# Patient Record
Sex: Male | Born: 1955 | Race: White | Hispanic: No | Marital: Married | Smoking: Never smoker
Health system: Southern US, Community
[De-identification: ages and names within clinical notes are randomized; demographics above are authoritative.]

## PROBLEM LIST (undated history)

## (undated) DIAGNOSIS — T8859XA Other complications of anesthesia, initial encounter: Secondary | ICD-10-CM

## (undated) DIAGNOSIS — T7840XA Allergy, unspecified, initial encounter: Secondary | ICD-10-CM

## (undated) DIAGNOSIS — I10 Essential (primary) hypertension: Secondary | ICD-10-CM

## (undated) DIAGNOSIS — M1990 Unspecified osteoarthritis, unspecified site: Secondary | ICD-10-CM

## (undated) HISTORY — PX: ROTATOR CUFF REPAIR: SHX139

## (undated) HISTORY — DX: Essential (primary) hypertension: I10

## (undated) HISTORY — PX: KNEE ARTHROSCOPY: SUR90

## (undated) HISTORY — PX: COLONOSCOPY: SHX174

## (undated) HISTORY — PX: POLYPECTOMY: SHX149

## (undated) HISTORY — DX: Allergy, unspecified, initial encounter: T78.40XA

---

## 1985-04-05 HISTORY — PX: ROTATOR CUFF REPAIR: SHX139

## 1991-04-06 HISTORY — PX: TREATMENT FISTULA ANAL: SUR1390

## 2008-11-24 ENCOUNTER — Emergency Department (HOSPITAL_BASED_OUTPATIENT_CLINIC_OR_DEPARTMENT_OTHER): Admission: EM | Admit: 2008-11-24 | Discharge: 2008-11-24 | Payer: Self-pay | Admitting: Emergency Medicine

## 2009-07-01 ENCOUNTER — Encounter (INDEPENDENT_AMBULATORY_CARE_PROVIDER_SITE_OTHER): Payer: Self-pay

## 2009-07-04 ENCOUNTER — Ambulatory Visit: Payer: Self-pay | Admitting: Internal Medicine

## 2009-07-16 ENCOUNTER — Ambulatory Visit: Payer: Self-pay | Admitting: Internal Medicine

## 2009-07-20 ENCOUNTER — Encounter: Payer: Self-pay | Admitting: Internal Medicine

## 2010-05-05 NOTE — Procedures (Signed)
Summary: Colonoscopy  Patient: [name] Note: All result statuses are Final unless otherwise noted.  Tests: (1) Colonoscopy (COL)   COL Colonoscopy           DONE     [facility]     [address]     [place], [place]  [address]           COLONOSCOPY PROCEDURE REPORT           PATIENT:  Jacob Rodriguez, Jacob Rodriguez  MR#:  [id number]     BIRTHDATE:  01-15-1956, 53 yrs. old  GENDER:  male     ENDOSCOPIST:  Wilhemina Bonito. Eda Keys, MD     REF. BY:  Jacalyn Lefevre, M.D.     PROCEDURE DATE:  07/16/2009     PROCEDURE:  Colonoscopy with snare polypectomy x 5     ASA CLASS:  Class I     INDICATIONS:  heme positive stool     MEDICATIONS:   Fentanyl 100 mcg IV, Versed 10 mg IV, Benadryl 50     mg IV           DESCRIPTION OF PROCEDURE:   After the risks benefits and     alternatives of the procedure were thoroughly explained, informed     consent was obtained.  Digital rectal exam was performed and     revealed a moderate but thrombosed external hemorrhoid.   The LB     [id number] endoscope was introduced through the anus and     advanced to the cecum, which was identified by both the appendix     and ileocecal valve, without limitations.Time to cecum = 2:57 min.     The quality of the prep was excellent, using MoviPrep.  The     instrument was then slowly withdrawn (time = 18:53 min) as the     colon was fully examined.     <<PROCEDUREIMAGES>>           FINDINGS:  Five polyps were found: 3mm, 4mm, 9mm in the ascending     colon; 2mm in the transverse colon; and 3mm in the sigmoid colon.     Polyps were snared without cautery. Retrieval was successful.     This was otherwise a normal examination of the colon.   Retroflexed     views in the rectum revealed no abnormalities.    The scope was     then withdrawn from the patient and the procedure completed.           COMPLICATIONS:  None     ENDOSCOPIC IMPRESSION:     1) Five polyps - Removed     2) Otherwise normal examination of  the colon     3) Thrombosed External hemorrhoid           RECOMMENDATIONS:     1) Follow up colonoscopy in 3 years     2) SITZ BATHS TWICE DAILY     3) ANAMANTLE CREAM THREE TIMES DAILY TO HEMORRHOID; 1 REFILL           ______________________________     Wilhemina Bonito. Eda Keys, MD           CC:  Jacalyn Lefevre, MD; The Patient           n.     eSIGNED:   Wilhemina Bonito. Eda Keys at 07/16/2009 09:00 AM           Caron Presume, [id number]  Note: An exclamation  mark (!) indicates a result that was not dispersed into the flowsheet. Document Creation Date: 07/16/2009 9:01 AM _______________________________________________________________________  (1) Order result status: Final Collection or observation date-time: 07/16/2009 08:42 Requested date-time:  Receipt date-time:  Reported date-time:  Referring Physician:   Ordering Physician: Fransico Setters [phone]) Specimen Source:  Source: Launa Grill Order Number: [id number] Lab site:   Appended Document: Colonoscopy     Procedures Next Due Date:    Colonoscopy: 07/2012

## 2010-05-05 NOTE — Miscellaneous (Signed)
Summary: Lec previsit  Clinical Lists Changes  Medications: Added new medication of MOVIPREP 100 GM  SOLR (PEG-KCL-NACL-NASULF-NA ASC-C) As per prep instructions. - Signed Rx of MOVIPREP 100 GM  SOLR (PEG-KCL-NACL-NASULF-NA ASC-C) As per prep instructions.;  #1 x 0;  Signed;  Entered by: Ulis Rias RN;  Authorized by: Hilarie Fredrickson MD;  Method used: Electronically to [id number]*, [address], [place], [place], [place]  [address], Ph: [phone], Fax: [phone] Observations: Added new observation of NKA: T (07/04/2009 8:22)    Prescriptions: MOVIPREP 100 GM  SOLR (PEG-KCL-NACL-NASULF-NA ASC-C) As per prep instructions.  #1 x 0   Entered by:   Ulis Rias RN   Authorized by:   Hilarie Fredrickson MD   Signed by:   Ulis Rias RN on 07/04/2009   Method used:   Electronically to        [facility] #[id number]* (retail)       [address]       [place], [place]  [address]       Ph: [phone]       Fax: [phone]   RxID:   [id number]

## 2010-05-05 NOTE — Letter (Signed)
Summary: [facility] Instructions  [facility]  [address] [place], [place] [address]   Phone: [phone]  Fax: [phone]       Jacob Rodriguez    1955-06-06    MRN: [id number]        Procedure Day Dorna Bloom: Wednesday  07/16/09     Arrival Time:  7:30am     Procedure Time:  8:00am     Location of Procedure:                    _X _  [facility] (4th Floor)                        PREPARATION FOR COLONOSCOPY WITH MOVIPREP   Starting 5 days prior to your procedure  Friday 04/08  do not eat nuts, seeds, popcorn, corn, beans, peas,  salads, or any raw vegetables.  Do not take any fiber supplements (e.g. Metamucil, Citrucel, and Benefiber).  THE DAY BEFORE YOUR PROCEDURE         DATE:  04/12   DAY:  Tuesday  1.  Drink clear liquids the entire day-NO SOLID FOOD  2.  Do not drink anything colored red or purple.  Avoid juices with pulp.  No orange juice.  3.  Drink at least 64 oz. (8 glasses) of fluid/clear liquids during the day to prevent dehydration and help the prep work efficiently.  CLEAR LIQUIDS INCLUDE: Water Jello Ice Popsicles Tea (sugar ok, no milk/cream) Powdered fruit flavored drinks Coffee (sugar ok, no milk/cream) Gatorade Juice: apple, white grape, white cranberry  Lemonade Clear bullion, consomm, broth Carbonated beverages (any kind) Strained chicken noodle soup Hard Candy                             4.  In the morning, mix first dose of MoviPrep solution:    Empty 1 Pouch A and 1 Pouch B into the disposable container    Add lukewarm drinking water to the top line of the container. Mix to dissolve    Refrigerate (mixed solution should be used within 24 hrs)  5.  Begin drinking the prep at 5:00 p.m. The MoviPrep container is divided by 4 marks.   Every 15 minutes drink the solution down to the next mark (approximately 8 oz) until the full liter is complete.   6.  Follow completed prep with 16 oz of clear liquid of your  choice (Nothing red or purple).  Continue to drink clear liquids until bedtime.  7.  Before going to bed, mix second dose of MoviPrep solution:    Empty 1 Pouch A and 1 Pouch B into the disposable container    Add lukewarm drinking water to the top line of the container. Mix to dissolve    Refrigerate  THE DAY OF YOUR PROCEDURE      DATE:  04/13  DAY: Wednesday  Beginning at  3:00 a.m. (5 hours before procedure):         1. Every 15 minutes, drink the solution down to the next mark (approx 8 oz) until the full liter is complete.  2. Follow completed prep with 16 oz. of clear liquid of your choice.    3. You may drink clear liquids until 6:00am  (2 HOURS BEFORE PROCEDURE).   MEDICATION INSTRUCTIONS  Unless otherwise instructed, you should take regular prescription medications with a small sip of  water   as early as possible the morning of your procedure.         OTHER INSTRUCTIONS  You will need a responsible adult at least 54 years of age to accompany you and drive you home.   This person must remain in the waiting room during your procedure.  Wear loose fitting clothing that is easily removed.  Leave jewelry and other valuables at home.  However, you may wish to bring a book to read or  an iPod/MP3 player to listen to music as you wait for your procedure to start.  Remove all body piercing jewelry and leave at home.  Total time from sign-in until discharge is approximately 2-3 hours.  You should go home directly after your procedure and rest.  You can resume normal activities the  day after your procedure.  The day of your procedure you should not:   Drive   Make legal decisions   Operate machinery   Drink alcohol   Return to work  You will receive specific instructions about eating, activities and medications before you leave.    The above instructions have been reviewed and explained to me by   Ulis Rias RN  July 04, 2009 8:38 AM     I fully  understand and can verbalize these instructions _____________________________ Date _________

## 2010-05-05 NOTE — Letter (Signed)
Summary: Patient Notice- Polyp Results  [facility]  [address] [place], [place] [address]   Phone: [phone]  Fax: [phone]        July 20, 2009 MRN: [id number]    Jacob Rodriguez [address] [place], [place]  [address]    Dear [name],  I am pleased to inform you that the colon polyp(s) removed during your recent colonoscopy was (were) found to be benign (no cancer detected) upon pathologic examination.  I recommend you have a repeat colonoscopy examination in 3 years to look for recurrent polyps, as having colon polyps increases your risk for having recurrent polyps or even colon cancer in the future.  Should you develop new or worsening symptoms of abdominal pain, bowel habit changes or bleeding from the rectum or bowels, please schedule an evaluation with either your primary care physician or with me.  Additional information/recommendations:  __ No further action with gastroenterology is needed at this time. Please      follow-up with your primary care physician for your other healthcare      needs.   Please call us if you are having persistent problems or have questions about your condition that have not been fully answered at this time.  Sincerely,  Hilarie Fredrickson MD  This letter has been electronically signed by your physician.  Appended Document: Patient Notice- Polyp Results letter mailed 4.18.11

## 2010-05-05 NOTE — Miscellaneous (Signed)
Summary: anamantle cream order  Clinical Lists Changes  Medications: Added new medication of ANAMANTLE HC 3-0.5 %  CREA (LIDOCAINE-HYDROCORTISONE ACE) three times a day to hemorrhoid - Signed Rx of ANAMANTLE HC 3-0.5 %  CREA (LIDOCAINE-HYDROCORTISONE ACE) three times a day to hemorrhoid;  #1 x 1;  Signed;  Entered by: Alease Frame RN;  Authorized by: Hilarie Fredrickson MD;  Method used: Electronically to [facility] #[id number]*, [address] [place], [place], [place]  [address], Ph: [phone], Fax: [phone] Observations: Added new observation of ALLERGY REV: Done (07/16/2009 9:11) Added new observation of NKA: T (07/16/2009 9:11)    Prescriptions: ANAMANTLE HC 3-0.5 %  CREA (LIDOCAINE-HYDROCORTISONE ACE) three times a day to hemorrhoid  #1 x 1   Entered by:   Alease Frame RN   Authorized by:   Hilarie Fredrickson MD   Signed by:   Alease Frame RN on 07/16/2009   Method used:   Electronically to        [facility] #[id number]* (retail)       [address]       [place], [place]  [address]       Ph: [phone]       Fax: [phone]   RxID:   [id number]

## 2011-05-30 ENCOUNTER — Other Ambulatory Visit: Payer: Self-pay | Admitting: Internal Medicine

## 2012-08-24 ENCOUNTER — Encounter: Payer: Self-pay | Admitting: Internal Medicine

## 2013-03-08 ENCOUNTER — Encounter: Payer: Self-pay | Admitting: Internal Medicine

## 2013-04-27 ENCOUNTER — Ambulatory Visit (AMBULATORY_SURGERY_CENTER): Payer: Self-pay | Admitting: *Deleted

## 2013-04-27 VITALS — Ht 74.5 in | Wt 213.0 lb

## 2013-04-27 DIAGNOSIS — Z860100 Personal history of colon polyps, unspecified: Secondary | ICD-10-CM

## 2013-04-27 MED ORDER — MOVIPREP 100 G PO SOLR: ORAL | Status: DC

## 2013-04-27 NOTE — Progress Notes (Signed)
Patient denies any allergies to eggs or soy. Patient denies any problems with anesthesia.  

## 2013-05-01 ENCOUNTER — Encounter: Payer: Self-pay | Admitting: Internal Medicine

## 2013-05-11 ENCOUNTER — Ambulatory Visit (AMBULATORY_SURGERY_CENTER): Payer: 59 | Admitting: Internal Medicine

## 2013-05-11 ENCOUNTER — Encounter: Payer: Self-pay | Admitting: Internal Medicine

## 2013-05-11 VITALS — BP 129/75 | HR 49 | Temp 96.1°F | Resp 71 | Ht 74.0 in | Wt 213.0 lb

## 2013-05-11 DIAGNOSIS — Z860100 Personal history of colon polyps, unspecified: Secondary | ICD-10-CM

## 2013-05-11 DIAGNOSIS — D126 Benign neoplasm of colon, unspecified: Secondary | ICD-10-CM

## 2013-05-11 MED ORDER — SODIUM CHLORIDE 0.9 % IV SOLN: 500.0000 mL | INTRAVENOUS | Status: DC

## 2013-05-11 NOTE — Patient Instructions (Signed)
YOU HAD AN ENDOSCOPIC PROCEDURE TODAY AT THE [facility]: Refer to the procedure report that was given to you for any specific questions about what was found during the examination.  If the procedure report does not answer your questions, please call your gastroenterologist to clarify.  If you requested that your care partner not be given the details of your procedure findings, then the procedure report has been included in a sealed envelope for you to review at your convenience later.  YOU SHOULD EXPECT: Some feelings of bloating in the abdomen. Passage of more gas than usual.  Walking can help get rid of the air that was put into your GI tract during the procedure and reduce the bloating. If you had a lower endoscopy (such as a colonoscopy or flexible sigmoidoscopy) you may notice spotting of blood in your stool or on the toilet paper. If you underwent a bowel prep for your procedure, then you may not have a normal bowel movement for a few days.  DIET: Your first meal following the procedure should be a light meal and then it is ok to progress to your normal diet.  A half-sandwich or bowl of soup is an example of a good first meal.  Heavy or fried foods are harder to digest and may make you feel nauseous or bloated.  Likewise meals heavy in dairy and vegetables can cause extra gas to form and this can also increase the bloating.  Drink plenty of fluids but you should avoid alcoholic beverages for 24 hours.  ACTIVITY: Your care partner should take you home directly after the procedure.  You should plan to take it easy, moving slowly for the rest of the day.  You can resume normal activity the day after the procedure however you should NOT DRIVE or use heavy machinery for 24 hours (because of the sedation medicines used during the test).    SYMPTOMS TO REPORT IMMEDIATELY: A gastroenterologist can be reached at any hour.  During normal business hours, 8:30 AM to 5:00 PM Monday through Friday,  call [phone].  After hours and on weekends, please call the GI answering service at [phone] who will take a message and have the physician on call contact you.   Following lower endoscopy (colonoscopy or flexible sigmoidoscopy):  Excessive amounts of blood in the stool  Significant tenderness or worsening of abdominal pains  Swelling of the abdomen that is new, acute  Fever of 100F or higher  Following upper endoscopy (EGD)  Vomiting of blood or coffee ground material  New chest pain or pain under the shoulder blades  Painful or persistently difficult swallowing  New shortness of breath  Fever of 100F or higher  Black, tarry-looking stools  FOLLOW UP: If any biopsies were taken you will be contacted by phone or by letter within the next 1-3 weeks.  Call your gastroenterologist if you have not heard about the biopsies in 3 weeks.  Our staff will call the home number listed on your records the next business day following your procedure to check on you and address any questions or concerns that you may have at that time regarding the information given to you following your procedure. This is a courtesy call and so if there is no answer at the home number and we have not heard from you through the emergency physician on call, we will assume that you have returned to your regular daily activities without incident.  SIGNATURES/CONFIDENTIALITY: You and/or your care   partner have signed paperwork which will be entered into your electronic medical record.  These signatures attest to the fact that that the information above on your After Visit Summary has been reviewed and is understood.  Full responsibility of the confidentiality of this discharge information lies with you and/or your care-partner.  Polyp information given. 

## 2013-05-11 NOTE — Progress Notes (Signed)
Called to room to assist during endoscopic procedure.  Patient ID and intended procedure confirmed with present staff. Received instructions for my participation in the procedure from the performing physician.  

## 2013-05-11 NOTE — Op Note (Signed)
[place]  [address] [place], [address]   COLONOSCOPY PROCEDURE REPORT  PATIENT: Jacob Rodriguez, Jacob Rodriguez  MR#: [id number] BIRTHDATE: 12-23-1955 , 57  yrs. old GENDER: Male ENDOSCOPIST: Eustace Quail, MD REFERRED DD:[id number] Program Recall PROCEDURE DATE:  05/11/2013 PROCEDURE:   Colonoscopy with snare polypectomy x 2 First Screening Colonoscopy - Avg.  risk and is 50 yrs.  old or older - No.  Prior Negative Screening - Now for repeat screening. N/A  History of Adenoma - Now for follow-up colonoscopy & has been > or = to 3 yrs.  Yes hx of adenoma.  Has been 3 or more years since last colonoscopy.  Polyps Removed Today? Yes. ASA CLASS:   Class II INDICATIONS:Patient's personal history of adenomatous colon polyps. Index exam April 2011 with 5 subcentimeter adenomas MEDICATIONS: MAC sedation, administered by CRNA and propofol (Diprivan) 450mg  IV  DESCRIPTION OF PROCEDURE:   After the risks benefits and alternatives of the procedure were thoroughly explained, informed consent was obtained.  A digital rectal exam revealed no abnormalities of the rectum.   The LB [id number]  endoscope was introduced through the anus and advanced to the cecum, which was identified by both the appendix and ileocecal valve. No adverse events experienced.   The quality of the prep was excellent, using MoviPrep  The instrument was then slowly withdrawn as the colon was fully examined.  COLON FINDINGS: Two diminutive polyps were found in the transverse colon and sigmoid colon.  A polypectomy was performed with a cold snare.  The resection was complete and the polyp tissue was completely retrieved.   The colon mucosa was otherwise normal. Retroflexed views revealed internal hemorrhoids. The time to cecum=3 minutes 05 seconds.  Withdrawal time=11 minutes 04 seconds. The scope was withdrawn and the procedure completed. COMPLICATIONS: There were no complications.  ENDOSCOPIC  IMPRESSION: 1.   Two diminutive polyps were found in the transverse  and sigmoid colon; polypectomy was performed with a cold snare 2.   The colon mucosa was otherwise normal  RECOMMENDATIONS: 1. Follow up colonoscopy in 5 years   eSigned:  Eustace Quail, MD 05/11/2013 10:07 AM   cc: The Patient

## 2013-05-11 NOTE — Progress Notes (Signed)
Report to pacu rn, vss, bbs=clear 

## 2013-05-14 ENCOUNTER — Telehealth: Payer: Self-pay | Admitting: *Deleted

## 2013-05-14 NOTE — Telephone Encounter (Signed)
  Follow up Call-  Call back number 05/11/2013  Post procedure Call Back phone  # [phone]  Permission to leave phone message Yes     Patient questions:  Do you have a fever, pain , or abdominal swelling? no Pain Score  0 *  Have you tolerated food without any problems? yes  Have you been able to return to your normal activities? yes  Do you have any questions about your discharge instructions: Diet   no Medications  no Follow up visit  no  Do you have questions or concerns about your Care? no  Actions: * If pain score is 4 or above: No action needed, pain <4.

## 2013-05-15 ENCOUNTER — Encounter: Payer: Self-pay | Admitting: Internal Medicine

## 2013-11-20 ENCOUNTER — Encounter: Payer: Self-pay | Admitting: Internal Medicine

## 2014-08-06 ENCOUNTER — Emergency Department (HOSPITAL_BASED_OUTPATIENT_CLINIC_OR_DEPARTMENT_OTHER)
Admission: EM | Admit: 2014-08-06 | Discharge: 2014-08-06 | Disposition: A | Discharge status: Home / Self Care | Payer: Managed Care, Other (non HMO) | Attending: Emergency Medicine | Admitting: Emergency Medicine

## 2014-08-06 ENCOUNTER — Emergency Department (HOSPITAL_BASED_OUTPATIENT_CLINIC_OR_DEPARTMENT_OTHER): Payer: Managed Care, Other (non HMO)

## 2014-08-06 ENCOUNTER — Encounter (HOSPITAL_BASED_OUTPATIENT_CLINIC_OR_DEPARTMENT_OTHER): Payer: Self-pay | Admitting: Emergency Medicine

## 2014-08-06 ENCOUNTER — Other Ambulatory Visit: Payer: Self-pay

## 2014-08-06 DIAGNOSIS — R0789 Other chest pain: Secondary | ICD-10-CM | POA: Insufficient documentation

## 2014-08-06 DIAGNOSIS — Z79899 Other long term (current) drug therapy: Secondary | ICD-10-CM | POA: Insufficient documentation

## 2014-08-06 DIAGNOSIS — R002 Palpitations: Secondary | ICD-10-CM | POA: Diagnosis present

## 2014-08-06 LAB — BASIC METABOLIC PANEL
Anion gap: 7 (ref 5–15)
BUN: 18 mg/dL (ref 6–20)
CO2: 26 mmol/L (ref 22–32)
Calcium: 9.1 mg/dL (ref 8.9–10.3)
Chloride: 105 mmol/L (ref 101–111)
Creatinine, Ser: 0.83 mg/dL (ref 0.61–1.24)
Glucose, Bld: 115 mg/dL — ABNORMAL HIGH (ref 70–99)
Potassium: 3.8 mmol/L (ref 3.5–5.1)
SODIUM: 138 mmol/L (ref 135–145)

## 2014-08-06 LAB — CBC
HCT: 41.7 % (ref 39.0–52.0)
Hemoglobin: 14.6 g/dL (ref 13.0–17.0)
MCH: 32.5 pg (ref 26.0–34.0)
MCHC: 35 g/dL (ref 30.0–36.0)
MCV: 92.9 fL (ref 78.0–100.0)
Platelets: 254 10*3/uL (ref 150–400)
RBC: 4.49 MIL/uL (ref 4.22–5.81)
RDW: 12.5 % (ref 11.5–15.5)
WBC: 5.7 10*3/uL (ref 4.0–10.5)

## 2014-08-06 LAB — TROPONIN I: Troponin I: <0.03 ng/mL (ref <0.031)

## 2014-08-06 NOTE — Discharge Instructions (Signed)

## 2014-08-06 NOTE — ED Notes (Signed)
EKG done at 1438 by EMT [facility]

## 2014-08-06 NOTE — ED Notes (Signed)
59 yo c/o heart racing with head fullness for a few weeks. Reports lying down in bed and feeling his heart race and pound. Denies Pain, SOB, N/V. Pt ambulatory.

## 2014-08-06 NOTE — ED Notes (Signed)
md at bedside

## 2014-08-06 NOTE — ED Provider Notes (Signed)
CSN: [id number]     Arrival date & time 08/06/14  1432 History   First MD Initiated Contact with Patient 08/06/14 1530     Chief Complaint  Patient presents with  . Palpitations     (Consider location/radiation/quality/duration/timing/severity/associated sxs/prior Treatment) HPI Comments: Patient presents with palpitations. He states for the last 2-3 weeks she's had episodes where he feels like his heart is racing. He states he is His Heart Rate and during These Episodes It's Beating over 100 Times a Minute Whereas His Normal Resting Heart Rate Is in the 60s. During These Episodes He Feels like His Head Is Pounding and His Chest Is Pounding. He Feels a Little Bit Dizzy but Does Not Feel near syncopal.  He denies any shortness of breath. He does travel a lot. He denies any calf tenderness or unilateral leg swelling. He's noted when he plays golf that is socks are little tighter than normal at times. He denies any past history of heart problems. He has not ever had a stress test however. He does not have a regular physician. He denies any tobacco use. He denies any excessive caffeine use. He states the episodes last about 30-45 minutes and then he goes back to normal. They are not brought on by exertion.  Patient is a 59 y.o. male presenting with palpitations.  Palpitations Associated symptoms: no back pain, no chest pain, no cough, no diaphoresis, no dizziness, no nausea, no numbness, no shortness of breath, no vomiting and no weakness     History reviewed. No pertinent past medical history. Past Surgical History  Procedure Laterality Date  . Rotator cuff repair    . Knee arthroscopy      rt x3   Family History  Problem Relation Age of Onset  . Colon cancer Neg Hx    History  Substance Use Topics  . Smoking status: Never Smoker   . Smokeless tobacco: Never Used  . Alcohol Use: 4.2 oz/week    7 Standard drinks or equivalent per week    Review of Systems  Constitutional: Negative for  fever, chills, diaphoresis and fatigue.  HENT: Negative for congestion, rhinorrhea and sneezing.   Eyes: Negative.   Respiratory: Positive for chest tightness (during palpitations). Negative for cough and shortness of breath.   Cardiovascular: Positive for palpitations. Negative for chest pain and leg swelling.  Gastrointestinal: Negative for nausea, vomiting, abdominal pain, diarrhea and blood in stool.  Genitourinary: Negative for frequency, hematuria, flank pain and difficulty urinating.  Musculoskeletal: Negative for back pain and arthralgias.  Skin: Negative for rash.  Neurological: Negative for dizziness, speech difficulty, weakness, numbness and headaches.      Allergies  Review of patient's allergies indicates no known allergies.  Home Medications   Prior to Admission medications   Medication Sig Start Date End Date Taking? Authorizing Provider  finasteride (PROPECIA) 1 MG tablet Take 1 mg by mouth daily.   Yes Historical Provider, MD  glucosamine-chondroitin 500-400 MG tablet Take 1 tablet by mouth daily.   Yes Historical Provider, MD  Multiple Vitamin (MULTIVITAMIN) tablet Take 1 tablet by mouth daily.   Yes Historical Provider, MD  zolpidem (AMBIEN) 10 MG tablet Take 10 mg by mouth at bedtime as needed for sleep.   Yes Historical Provider, MD   BP 136/87 mmHg  Pulse 64  Temp(Src) 98.1 F (36.7 C) (Oral)  Resp 16  Ht 6\' 2"  (1.88 m)  Wt 210 lb (95.255 kg)  BMI 26.95 kg/m2  SpO2 100% Physical Exam  Constitutional: He is oriented to person, place, and time. He appears well-developed and well-nourished.  HENT:  Head: Normocephalic and atraumatic.  Eyes: Pupils are equal, round, and reactive to light.  Neck: Normal range of motion. Neck supple.  Cardiovascular: Normal rate, regular rhythm and normal heart sounds.   Pulmonary/Chest: Effort normal and breath sounds normal. No respiratory distress. He has no wheezes. He has no rales. He exhibits no tenderness.  Abdominal:  Soft. Bowel sounds are normal. There is no tenderness. There is no rebound and no guarding.  Musculoskeletal: Normal range of motion. He exhibits no edema.  No edema or calf tenderness  Lymphadenopathy:    He has no cervical adenopathy.  Neurological: He is alert and oriented to person, place, and time.  Skin: Skin is warm and dry. No rash noted.  Psychiatric: He has a normal mood and affect.    ED Course  Procedures (including critical care time) Labs Review Results for orders placed or performed during the hospital encounter of 08/06/14  Troponin I  Result Value Ref Range   Troponin I <0.03 <0.031 ng/mL  CBC  Result Value Ref Range   WBC 5.7 4.0 - 10.5 K/uL   RBC 4.49 4.22 - 5.81 MIL/uL   Hemoglobin 14.6 13.0 - 17.0 g/dL   HCT 41.7 39.0 - 52.0 %   MCV 92.9 78.0 - 100.0 fL   MCH 32.5 26.0 - 34.0 pg   MCHC 35.0 30.0 - 36.0 g/dL   RDW 12.5 11.5 - 15.5 %   Platelets 254 150 - 400 K/uL  Basic metabolic panel  Result Value Ref Range   Sodium 138 135 - 145 mmol/L   Potassium 3.8 3.5 - 5.1 mmol/L   Chloride 105 101 - 111 mmol/L   CO2 26 22 - 32 mmol/L   Glucose, Bld 115 (H) 70 - 99 mg/dL   BUN 18 6 - 20 mg/dL   Creatinine, Ser 0.83 0.61 - 1.24 mg/dL   Calcium 9.1 8.9 - 10.3 mg/dL   GFR calc non Af Amer >60 >60 mL/min   GFR calc Af Amer >60 >60 mL/min   Anion gap 7 5 - 15   Dg Chest 2 View  08/06/2014   CLINICAL DATA:  Cardiac palpitations  EXAM: CHEST  2 VIEW  COMPARISON:  None.  FINDINGS: Lungs are clear. Heart size and pulmonary vascularity are normal. No adenopathy. No bone lesions.  IMPRESSION: No edema or consolidation.   Electronically Signed   By: Lowella Grip III M.D.   On: 08/06/2014 15:01      Imaging Review Dg Chest 2 View  08/06/2014   CLINICAL DATA:  Cardiac palpitations  EXAM: CHEST  2 VIEW  COMPARISON:  None.  FINDINGS: Lungs are clear. Heart size and pulmonary vascularity are normal. No adenopathy. No bone lesions.  IMPRESSION: No edema or consolidation.    Electronically Signed   By: Lowella Grip III M.D.   On: 08/06/2014 15:01     EKG Interpretation   Date/Time:  Tuesday Aug 06 2014 14:38:00 EDT Ventricular Rate:  63 PR Interval:  174 QRS Duration: 104 QT Interval:  392 QTC Calculation: 401 R Axis:   61 Text Interpretation:  Normal sinus rhythm Septal infarct , age  undetermined Abnormal ECG No old tracing to compare Confirmed by Yanitza Shvartsman   MD, Fults ([id number]) on 08/06/2014 3:33:02 PM      MDM   Final diagnoses:  Palpitations    Patient presents with heart palpitations. He has no  syncope or near syncope associated with the palpitations. It's nonexertional. He does have chest pain but only with the palpitations. He's had no symptoms while being in the ED. He has no ischemic changes on EKG. His troponin is negative. Encouraged him to have close follow-up with a cardiologist for possible Holter monitoring and possible stress test. I gave him a referral to the [facility] cardiology office. He also requested a local primary care referral and I referred him to the [facility] here at [facility]. I advised him return for symptoms worsen.    Malvin Johns, MD 08/06/14 1700

## 2016-02-16 ENCOUNTER — Encounter (HOSPITAL_BASED_OUTPATIENT_CLINIC_OR_DEPARTMENT_OTHER): Payer: Self-pay | Admitting: Emergency Medicine

## 2016-02-16 ENCOUNTER — Emergency Department (HOSPITAL_BASED_OUTPATIENT_CLINIC_OR_DEPARTMENT_OTHER)
Admission: EM | Admit: 2016-02-16 | Discharge: 2016-02-16 | Disposition: A | Discharge status: Home / Self Care | Payer: Managed Care, Other (non HMO) | Attending: Emergency Medicine | Admitting: Emergency Medicine

## 2016-02-16 DIAGNOSIS — R21 Rash and other nonspecific skin eruption: Secondary | ICD-10-CM | POA: Diagnosis present

## 2016-02-16 DIAGNOSIS — L509 Urticaria, unspecified: Secondary | ICD-10-CM | POA: Diagnosis not present

## 2016-02-16 MED ORDER — DIPHENHYDRAMINE HCL 25 MG PO TABS
25.0000 mg | ORAL_TABLET | Freq: Four times a day (QID) | ORAL | Status: DC | PRN
Start: 2016-02-16 — End: 2018-04-04

## 2016-02-16 MED ORDER — FAMOTIDINE IN NACL 20-0.9 MG/50ML-% IV SOLN
20.0000 mg | Freq: Once | INTRAVENOUS | Status: AC
  Administered 2016-02-16: 20 mg via INTRAVENOUS
  Filled 2016-02-16: qty 50

## 2016-02-16 MED ORDER — DIPHENHYDRAMINE HCL 50 MG/ML IJ SOLN
50.0000 mg | Freq: Once | INTRAMUSCULAR | Status: AC
  Administered 2016-02-16: 50 mg via INTRAVENOUS
  Filled 2016-02-16: qty 1

## 2016-02-16 MED ORDER — FAMOTIDINE 20 MG PO TABS: ORAL_TABLET | ORAL | Status: DC

## 2016-02-16 NOTE — ED Triage Notes (Signed)
Pt states he started having hives yesterday. He was seen by a PCP who gave him a shot of steroids. Initially he saw some improvement, but it has now worsened. Pt has generalized hives and itching. Denies airway involvement.

## 2016-02-16 NOTE — ED Notes (Signed)
Pt's hives markedly improved. Pt reports a substantial decrease in itching. EDP notified.

## 2016-02-16 NOTE — ED Provider Notes (Addendum)
[facility] DEPT [facility] Provider Note: Georgena Spurling, MD, FACEP  CSN: [id number] MRN: [id number] ARRIVAL: 02/16/16 at [place]: [place]  Allergic Reaction   HISTORY OF PRESENT ILLNESS  Jacob Rodriguez is a 60 y.o. male who developed generalized itching and an urticarial rash 2 days ago. The onset was gradual. He was seen in his PCPs office yesterday and was diagnosed with urticaria. He was given 125 milligrams of Solu-Medrol IM and started on a methylprednisolone taper. He was prescribed Zyrtec which she has not taken. His steady is been taking Benadryl 25 milligrams every 5-6 hours. He had transient improvement but this morning his symptoms have worsened and are now severe. He denies throat swelling, shortness of breath, wheezing, nausea, vomiting or diarrhea. His symptoms abated very anxious and he was noted to be tachycardic and tremulous on arrival.He is not sure what the trigger was but suspects it may be a new laundry detergent.   History reviewed. No pertinent past medical history.  Past Surgical History:  Procedure Laterality Date  . KNEE ARTHROSCOPY     rt x3  . ROTATOR CUFF REPAIR      Family History  Problem Relation Age of Onset  . Colon cancer Neg Hx     Social History  Substance Use Topics  . Smoking status: Never Smoker  . Smokeless tobacco: Never Used  . Alcohol use 4.2 oz/week    7 Standard drinks or equivalent per week    Prior to Admission medications   Medication Sig Start Date End Date Taking? Authorizing Provider  finasteride (PROPECIA) 1 MG tablet Take 1 mg by mouth daily.    Historical Provider, MD  glucosamine-chondroitin 500-400 MG tablet Take 1 tablet by mouth daily.    Historical Provider, MD  Multiple Vitamin (MULTIVITAMIN) tablet Take 1 tablet by mouth daily.    Historical Provider, MD  zolpidem (AMBIEN) 10 MG tablet Take 10 mg by mouth at bedtime as needed for sleep.    Historical Provider, MD    Allergies Patient  has no known allergies.   REVIEW OF SYSTEMS  Negative except as noted here or in the History of Present Illness.   PHYSICAL EXAMINATION  Initial Vital Signs Blood pressure 155/99, pulse (!) 126, temperature 97.9 F (36.6 C), temperature source Oral, resp. rate 24, height 6\' 2"  (1.88 m), weight 210 lb (95.3 kg), SpO2 98 %.  Examination General: Well-developed, well-nourished male in no acute distress; appearance consistent with age of record HENT: normocephalic; atraumatic; no pharyngeal edema; no dysphonia; no stridor Eyes: pupils equal, round and reactive to light; extraocular muscles intact Neck: supple Heart: regular rate and rhythm; tachycardia Lungs: clear to auscultation bilaterally Abdomen: soft; nondistended; nontender; no masses or hepatosplenomegaly; bowel sounds present Extremities: No deformity; full range of motion; pulses normal Neurologic: Awake, alert and oriented; motor function intact in all extremities and symmetric; no facial droop; tremulous Skin: Warm and dry; generalized urticaria, confluent in areas:   Psychiatric: Anxious   RESULTS  Summary of this visit's results, reviewed by myself:   EKG Interpretation  Date/Time:    Ventricular Rate:    PR Interval:    QRS Duration:   QT Interval:    QTC Calculation:   R Axis:     Text Interpretation:        Laboratory Studies: No results found for this or any previous visit (from the past 24 hour(s)). Imaging Studies: No results found.  ED COURSE  Nursing notes and  initial vitals signs, including pulse oximetry, reviewed.  Vitals:   02/16/16 0443 02/16/16 0445  BP: 155/99   Pulse: (!) 126   Resp: 24   Temp: 97.9 F (36.6 C)   TempSrc: Oral   SpO2: 98%   Weight:  210 lb (95.3 kg)  Height:  6\' 2"  (1.88 m)   6:03 AM Significant improvement after IV Benadryl and Pepcid. Itching has resolved and urticaria is substantially improved. Patient was advised to avoid contact with any thing washed in  the new detergent until it has been re-washed in detergent he knows is safe. We will have him continue Benadryl 50 milligrams every 6 hours.  PROCEDURES    ED DIAGNOSES     ICD-9-CM ICD-10-CM   1. Urticaria 708.9 L50.9        Shanon Rosser, MD 02/16/16 [id number]    Shanon Rosser, MD 02/16/16 [phone]

## 2016-03-01 ENCOUNTER — Encounter: Payer: Self-pay | Admitting: Sports Medicine

## 2016-03-01 ENCOUNTER — Ambulatory Visit (INDEPENDENT_AMBULATORY_CARE_PROVIDER_SITE_OTHER): Payer: Worker's Compensation | Admitting: Sports Medicine

## 2016-03-01 ENCOUNTER — Other Ambulatory Visit: Payer: Worker's Compensation | Admitting: Sports Medicine

## 2016-03-01 DIAGNOSIS — M7521 Bicipital tendinitis, right shoulder: Secondary | ICD-10-CM

## 2016-03-01 NOTE — Assessment & Plan Note (Addendum)
Referral from [facility] and orthopedic surgery for distal biceps injection. He has failed conservative measures, injection as above, I placed medication from both a volar and a dorsal aspect over the distal biceps tendon at its insertion on the radial tuberosity.

## 2016-03-01 NOTE — Progress Notes (Signed)
Subjective:    I'm seeing this patient as a consultation for:  Dr. Yvetta Coder, Dr. French Ana  CC: Right elbow pain  HPI: For several months this pleasant 60 year old male has had pain that he localizes on the anterior aspect of his right elbow, he has been through over a month of formal physical therapy, without improvement. More recently he had an MRI that showed distal bicipital tendinosis, and he was referred to sports medicine for consideration of distal biceps tendon sheath injection. Pain is moderate, persistent, localized without radiation.  Past medical history:  Negative.  See flowsheet/record as well for more information.  Surgical history: Negative.  See flowsheet/record as well for more information.  Family history: Negative.  See flowsheet/record as well for more information.  Social history: Negative.  See flowsheet/record as well for more information.  Allergies, and medications have been entered into the medical record, reviewed, and no changes needed.   Review of Systems: No headache, visual changes, nausea, vomiting, diarrhea, constipation, dizziness, abdominal pain, skin rash, fevers, chills, night sweats, weight loss, swollen lymph nodes, body aches, joint swelling, muscle aches, chest pain, shortness of breath, mood changes, visual or auditory hallucinations.   Objective:   General: Well Developed, well nourished, and in no acute distress.  Neuro/Psych: Alert and oriented x3, extra-ocular muscles intact, able to move all 4 extremities, sensation grossly intact. Skin: Warm and dry, no rashes noted.  Respiratory: Not using accessory muscles, speaking in full sentences, trachea midline.  Cardiovascular: Pulses palpable, no extremity edema. Abdomen: Does not appear distended. Right Elbow: Unremarkable to inspection. Range of motion full pronation, supination, flexion, extension. Strength is weak to supination, with reproduction of pain, passive pronation also reproduces  pain. Stable to varus, valgus stress. Negative moving valgus stress test. Tender to palpation anteriorly at the distal biceps  Ulnar nerve does not sublux. Negative cubital tunnel Tinel's.  Procedure: Real-time Ultrasound Guided Injection of right distal biceps insertion Device: GE Logiq E  Verbal informed consent obtained.  Time-out conducted.  Noted no overlying erythema, induration, or other signs of local infection.  Skin prepped in a sterile fashion.  Local anesthesia: Topical Ethyl chloride.  With sterile technique and under real time ultrasound guidance:  Initially we took an anterior approach and the needle was guided to the distal biceps tendon sheath just proximal to the radial tuberosity. I injected 1 mL kenalog 40, 1 mL lidocaine, 1 mL Marcaine. This resulted in partial pain relief, I then turned my attention to the dorsal forearm, I had the patient fully pronate and I was able to see the remaining distal biceps insertion at the radial tuberosity, using another 25-gauge needle I injected medication both superficial to and deep to the distal biceps tendon, taking care to avoid intratendinous injection for an additional 1 mL kenalog 40, 1 mL lidocaine, 1 mL Marcaine Completed without difficulty  Pain immediately resolved suggesting accurate placement of the medication.  Advised to call if fevers/chills, erythema, induration, drainage, or persistent bleeding.  Images permanently stored and available for review in the ultrasound unit.  Impression: Technically successful ultrasound guided injection.  Impression and Recommendations:   This case required medical decision making of moderate complexity.  Right distal insertional biceps tendinosis Referral from [facility] and orthopedic surgery for distal biceps injection. He has failed conservative measures, injection as above, I placed medication from both a volar and a dorsal aspect over the distal biceps tendon at its  insertion on the radial tuberosity.

## 2016-03-30 ENCOUNTER — Ambulatory Visit: Payer: Self-pay | Admitting: Sports Medicine

## 2016-04-05 HISTORY — PX: BICEPS TENDON REPAIR: SHX566

## 2018-04-04 ENCOUNTER — Ambulatory Visit (INDEPENDENT_AMBULATORY_CARE_PROVIDER_SITE_OTHER): Payer: 59 | Admitting: Family Medicine

## 2018-04-04 ENCOUNTER — Encounter: Payer: Self-pay | Admitting: Family Medicine

## 2018-04-04 VITALS — BP 170/94 | HR 87 | Ht 74.0 in | Wt 220.4 lb

## 2018-04-04 DIAGNOSIS — I10 Essential (primary) hypertension: Secondary | ICD-10-CM | POA: Diagnosis not present

## 2018-04-04 DIAGNOSIS — F4329 Adjustment disorder with other symptoms: Secondary | ICD-10-CM | POA: Insufficient documentation

## 2018-04-04 DIAGNOSIS — R030 Elevated blood-pressure reading, without diagnosis of hypertension: Secondary | ICD-10-CM | POA: Diagnosis not present

## 2018-04-04 MED ORDER — CARVEDILOL PHOSPHATE ER 20 MG PO CP24: 20.0000 mg | ORAL_CAPSULE | Freq: Every day | ORAL | 2 refills | Status: DC

## 2018-04-04 MED ORDER — LORAZEPAM 0.5 MG PO TABS: 0.5000 mg | ORAL_TABLET | Freq: Two times a day (BID) | ORAL | 0 refills | Status: DC

## 2018-04-04 NOTE — Progress Notes (Signed)
Established Patient Office Visit  Subjective:  Patient ID: Jacob Rodriguez, male    DOB: Jul 25, 1955  Age: 62 y.o. MRN: [id number]  CC:  Chief Complaint  Patient presents with  . Establish Care    HPI Jacob Rodriguez presents for establishment of care and treatment and evaluation for acute stress reaction and his blood pressure.  An unknown Internet sources threatening black male with his company.  Patient is upset.  Review of chart shows elevated blood pressures in multiple encounters over the last several years.  Albeit blood pressures not been as high as it is today.  Patient does not smoke or use illicit drugs.  He does drink 2 alcoholic drinks daily. 0/4 with CAGE.  Both of his parents are deceased.  Father died at age 41 suicide.  Mother died in her late 50s from what sounds like smoking associated illness.  Patient is married.  He was taking meloxicam up to 2 days ago for knee pain.  He stopped it because he was concerned about its possible effect on his liver. History reviewed. No pertinent past medical history.  Past Surgical History:  Procedure Laterality Date  . KNEE ARTHROSCOPY     rt x3  . ROTATOR CUFF REPAIR      Family History  Problem Relation Age of Onset  . Colon cancer Neg Hx     Social History   Socioeconomic History  . Marital status: Married    Spouse name: Not on file  . Number of children: Not on file  . Years of education: Not on file  . Highest education level: Not on file  Occupational History  . Not on file  Social Needs  . Financial resource strain: Not on file  . Food insecurity:    Worry: Not on file    Inability: Not on file  . Transportation needs:    Medical: Not on file    Non-medical: Not on file  Tobacco Use  . Smoking status: Never Smoker  . Smokeless tobacco: Never Used  Substance and Sexual Activity  . Alcohol use: Yes    Alcohol/week: 7.0 standard drinks    Types: 7 Standard drinks or equivalent per week  . Drug use: No    . Sexual activity: Not on file  Lifestyle  . Physical activity:    Days per week: Not on file    Minutes per session: Not on file  . Stress: Not on file  Relationships  . Social connections:    Talks on phone: Not on file    Gets together: Not on file    Attends religious service: Not on file    Active member of club or organization: Not on file    Attends meetings of clubs or organizations: Not on file    Relationship status: Not on file  . Intimate partner violence:    Fear of current or ex partner: Not on file    Emotionally abused: Not on file    Physically abused: Not on file    Forced sexual activity: Not on file  Other Topics Concern  . Not on file  Social History Narrative  . Not on file    Outpatient Medications Prior to Visit  Medication Sig Dispense Refill  . glucosamine-chondroitin 500-400 MG tablet Take 1 tablet by mouth daily.    . Multiple Vitamin (MULTIVITAMIN) tablet Take 1 tablet by mouth daily.    . TURMERIC PO Take 1 tablet by mouth daily.    Marland Kitchen  VITAMIN D PO Take 1 tablet by mouth daily.    . diphenhydrAMINE (BENADRYL) 25 MG tablet Take 1 tablet (25 mg total) by mouth every 6 (six) hours as needed (for itching/hives).    . famotidine (PEPCID) 20 MG tablet Take one every 12 hours as needed for itching/hives.    . finasteride (PROPECIA) 1 MG tablet Take 1 mg by mouth daily.    Marland Kitchen zolpidem (AMBIEN) 10 MG tablet Take 10 mg by mouth at bedtime as needed for sleep.     No facility-administered medications prior to visit.     No Known Allergies  ROS Review of Systems  Constitutional: Negative for chills, diaphoresis, fatigue, fever and unexpected weight change.  HENT: Negative.   Eyes: Negative for photophobia and visual disturbance.  Respiratory: Negative.   Cardiovascular: Negative.   Gastrointestinal: Negative.   Endocrine: Negative for polyphagia and polyuria.  Genitourinary: Negative.   Musculoskeletal: Positive for arthralgias.  Skin: Negative  for pallor and rash.  Allergic/Immunologic: Negative for immunocompromised state.  Neurological: Positive for light-headedness. Negative for seizures, speech difficulty, numbness and headaches.  Hematological: Does not bruise/bleed easily.  Psychiatric/Behavioral: The patient is nervous/anxious.       Objective:    Physical Exam  Constitutional: He is oriented to person, place, and time. He appears well-developed and well-nourished. No distress.  HENT:  Head: Normocephalic and atraumatic.  Right Ear: External ear normal.  Left Ear: External ear normal.  Mouth/Throat: Oropharynx is clear and moist. No oropharyngeal exudate.  Eyes: Pupils are equal, round, and reactive to light. Conjunctivae are normal. Right eye exhibits no discharge. Left eye exhibits no discharge. No scleral icterus.  Neck: Neck supple. No JVD present. No tracheal deviation present. No thyromegaly present.  Cardiovascular: Normal rate, regular rhythm and normal heart sounds.  Pulmonary/Chest: Effort normal and breath sounds normal. No stridor.  Abdominal: Soft. Bowel sounds are normal. He exhibits no distension. There is no abdominal tenderness. There is no rebound and no guarding.  Lymphadenopathy:    He has no cervical adenopathy.  Neurological: He is alert and oriented to person, place, and time.  Skin: Skin is warm and dry. He is not diaphoretic.  Psychiatric: He has a normal mood and affect. His behavior is normal.    BP (!) 170/94   Pulse 87   Ht 6\' 2"  (1.88 m)   Wt 220 lb 6 oz (100 kg)   SpO2 98%   BMI 28.29 kg/m  Wt Readings from Last 3 Encounters:  04/04/18 220 lb 6 oz (100 kg)  03/01/16 210 lb (95.3 kg)  02/16/16 210 lb (95.3 kg)   BP Readings from Last 3 Encounters:  04/04/18 (!) 170/94  03/01/16 (!) 146/77  02/16/16 135/75   Guideline developer:  UpToDate (see UpToDate for funding source) Date Released: June 2014  Health Maintenance Due  Topic Date Due  . Hepatitis C Screening   01[birth year]  . HIV Screening  12/10/1971  . TETANUS/TDAP  12/10/1975  . INFLUENZA VACCINE  11/03/2017    There are no preventive care reminders to display for this patient.  No results found for: TSH Lab Results  Component Value Date   WBC 5.7 08/06/2014   HGB 14.6 08/06/2014   HCT 41.7 08/06/2014   MCV 92.9 08/06/2014   PLT 254 08/06/2014   Lab Results  Component Value Date   NA 138 08/06/2014   K 3.8 08/06/2014   CO2 26 08/06/2014   GLUCOSE 115 (H) 08/06/2014   BUN 18  08/06/2014   CREATININE 0.83 08/06/2014   CALCIUM 9.1 08/06/2014   ANIONGAP 7 08/06/2014   No results found for: CHOL No results found for: HDL No results found for: LDLCALC No results found for: TRIG No results found for: CHOLHDL No results found for: HGBA1C    Assessment & Plan:   Problem List Items Addressed This Visit      Cardiovascular and Mediastinum   Essential hypertension   Relevant Medications   carvedilol (COREG CR) 20 MG 24 hr capsule   Other Relevant Orders   Gamma GT   CBC   Comprehensive metabolic panel     Other   Elevated BP without diagnosis of hypertension - Primary   Relevant Orders   EKG 12-Lead (Completed)   Gamma GT   Stress and adjustment reaction   Relevant Medications   LORazepam (ATIVAN) 0.5 MG tablet      Meds ordered this encounter  Medications  . carvedilol (COREG CR) 20 MG 24 hr capsule    Sig: Take 1 capsule (20 mg total) by mouth at bedtime.    Dispense:  30 capsule    Refill:  2  . LORazepam (ATIVAN) 0.5 MG tablet    Sig: Take 1 tablet (0.5 mg total) by mouth 2 (two) times daily. AS NEEDED.    Dispense:  30 tablet    Refill:  0    Follow-up: Return in about 2 weeks (around 04/18/2018).   Offered patient counseling referral and he declined that.  He said that he had tried that in the past and it was not that helpful.  He agrees to short-term Ativan use.  Made the decision to treat his blood pressure not just based on today's blood pressure but a  review of the chart.  He understands this.  Patient was given information on the Ativan and carvedilol.  Is also given information on the DASH diet and hypertension.

## 2018-04-04 NOTE — Patient Instructions (Signed)
Adjustment Disorder, Adult Adjustment disorder is a group of symptoms that can develop after a stressful life event, such as the loss of a job or serious physical illness. The symptoms can affect how you feel, think, and act. They may interfere with your relationships. Adjustment disorder increases your risk of suicide and substance abuse. If this disorder is not managed early, it can develop into a more serious condition, such as major depressive disorder or post-traumatic stress disorder. What are the causes? This condition happens when you have trouble recovering from or coping with a stressful life event. What increases the risk? You are more likely to develop this condition if:  You have had depression or anxiety.  You are being treated for a long-term (chronic) illness.  You are being treated for an illness that cannot be cured (terminal illness).  You have a family history of mental illness. What are the signs or symptoms? Symptoms of this condition include:  Extreme trouble doing daily tasks, such as going to work.  Sadness, depression, or crying spells.  Worrying a lot.  Loss of enjoyment.  Change in appetite or weight.  Feelings of loss or hopelessness.  Thoughts of suicide.  Anxiety, worry, or nervousness.  Trouble sleeping.  Avoiding family and friends.  Fighting or vandalism.  Complaining of feeling sick without being ill.  Feeling dazed or disconnected.  Nightmares.  Trouble sleeping.  Irritability.  Reckless driving.  Poor work performance.  Ignoring bills. Symptoms of this condition start within three months of the stressful event. They do not last more than six months, unless the stressful circumstances last longer. Normal grieving after the death of a loved one is not a symptom of this condition. How is this diagnosed? To diagnose this condition, your health care provider will ask about what has happened in your life and how it has affected  you. He or she may also ask about your medical history and your use of medicines, alcohol, and other substances. Your health care provider may do a physical exam and order lab tests or other studies. You may be referred to a mental health specialist. How is this treated? Treatment options for this condition include:  Counseling or talk therapy. Talk therapy is usually provided by mental health specialists.  Medicines. Certain medicines may help with depression, anxiety, and sleep.  Support groups. These offer emotional support, advice, and guidance. They are made up of people who have had similar experiences.  Observation and time. This is sometimes called "watchful waiting." In this treatment, health care providers monitor your health and behavior without other treatment. Adjustment disorder sometimes gets better on its own with time. Follow these instructions at home:  Take over-the-counter and prescription medicines only as told by your health care provider.  Keep all follow-up visits as told by your health care provider. This is important. Contact a health care provider if:  Your symptoms do not improve in six months.  Your symptoms get worse. Get help right away if:  You have serious thoughts about hurting yourself or someone else. If you ever feel like you may hurt yourself or others, or have thoughts about taking your own life, get help right away. You can go to your nearest emergency department or call:  Your local emergency services (911 in the [place]  A suicide crisis helpline, such as the National Suicide Prevention Lifeline at 1-800-273-8255. This is open 24 hours a day. Summary  Adjustment disorder is a group of symptoms that can develop   after a stressful life event, such as the loss of a job or serious physical illness. The symptoms can affect how you feel, think, and act. They may interfere with your relationships.  Symptoms of this condition start within three months  of the stressful event. They do not last more than six months, unless the stressful circumstances last longer.  Treatment may include talk therapy, medicines, participation in a support group, or observation to see if symptoms improve.  Contact your health care provider if your symptoms get worse or do not improve in six months.  If you ever feel like you may hurt yourself or others, or have thoughts about taking your own life, get help right away. This information is not intended to replace advice given to you by your health care provider. Make sure you discuss any questions you have with your health care provider. Document Released: 11/24/2005 Document Revised: 05/21/2016 Document Reviewed: 05/21/2016 Elsevier Interactive Patient Education  2019 [employer].  Hypertension Hypertension, commonly called high blood pressure, is when the force of blood pumping through the arteries is too strong. The arteries are the blood vessels that carry blood from the heart throughout the body. Hypertension forces the heart to work harder to pump blood and may cause arteries to become narrow or stiff. Having untreated or uncontrolled hypertension can cause heart attacks, strokes, kidney disease, and other problems. A blood pressure reading consists of a higher number over a lower number. Ideally, your blood pressure should be below 120/80. The first ("top") number is called the systolic pressure. It is a measure of the pressure in your arteries as your heart beats. The second ("bottom") number is called the diastolic pressure. It is a measure of the pressure in your arteries as the heart relaxes. What are the causes? The cause of this condition is not known. What increases the risk? Some risk factors for high blood pressure are under your control. Others are not. Factors you can change  Smoking.  Having type 2 diabetes mellitus, high cholesterol, or both.  Not getting enough exercise or physical  activity.  Being overweight.  Having too much fat, sugar, calories, or salt (sodium) in your diet.  Drinking too much alcohol. Factors that are difficult or impossible to change  Having chronic kidney disease.  Having a family history of high blood pressure.  Age. Risk increases with age.  Race. You may be at higher risk if you are African-American.  Gender. Men are at higher risk than women before age 74. After age 14, women are at higher risk than men.  Having obstructive sleep apnea.  Stress. What are the signs or symptoms? Extremely high blood pressure (hypertensive crisis) may cause:  Headache.  Anxiety.  Shortness of breath.  Nosebleed.  Nausea and vomiting.  Severe chest pain.  Jerky movements you cannot control (seizures). How is this diagnosed? This condition is diagnosed by measuring your blood pressure while you are seated, with your arm resting on a surface. The cuff of the blood pressure monitor will be placed directly against the skin of your upper arm at the level of your heart. It should be measured at least twice using the same arm. Certain conditions can cause a difference in blood pressure between your right and left arms. Certain factors can cause blood pressure readings to be lower or higher than normal (elevated) for a short period of time:  When your blood pressure is higher when you are in a health care provider's office than  when you are at home, this is called white coat hypertension. Most people with this condition do not need medicines.  When your blood pressure is higher at home than when you are in a health care provider's office, this is called masked hypertension. Most people with this condition may need medicines to control blood pressure. If you have a high blood pressure reading during one visit or you have normal blood pressure with other risk factors:  You may be asked to return on a different day to have your blood pressure checked  again.  You may be asked to monitor your blood pressure at home for 1 week or longer. If you are diagnosed with hypertension, you may have other blood or imaging tests to help your health care provider understand your overall risk for other conditions. How is this treated? This condition is treated by making healthy lifestyle changes, such as eating healthy foods, exercising more, and reducing your alcohol intake. Your health care provider may prescribe medicine if lifestyle changes are not enough to get your blood pressure under control, and if:  Your systolic blood pressure is above 130.  Your diastolic blood pressure is above 80. Your personal target blood pressure may vary depending on your medical conditions, your age, and other factors. Follow these instructions at home: Eating and drinking   Eat a diet that is high in fiber and potassium, and low in sodium, added sugar, and fat. An example eating plan is called the DASH (Dietary Approaches to Stop Hypertension) diet. To eat this way: ? Eat plenty of fresh fruits and vegetables. Try to fill half of your plate at each meal with fruits and vegetables. ? Eat whole grains, such as whole wheat pasta, brown rice, or whole grain bread. Fill about one quarter of your plate with whole grains. ? Eat or drink low-fat dairy products, such as skim milk or low-fat yogurt. ? Avoid fatty cuts of meat, processed or cured meats, and poultry with skin. Fill about one quarter of your plate with lean proteins, such as fish, chicken without skin, beans, eggs, and tofu. ? Avoid premade and processed foods. These tend to be higher in sodium, added sugar, and fat.  Reduce your daily sodium intake. Most people with hypertension should eat less than 1,500 mg of sodium a day.  Limit alcohol intake to no more than 1 drink a day for nonpregnant women and 2 drinks a day for men. One drink equals 12 oz of beer, 5 oz of wine, or 1 oz of hard  liquor. Lifestyle   Work with your health care provider to maintain a healthy body weight or to lose weight. Ask what an ideal weight is for you.  Get at least 30 minutes of exercise that causes your heart to beat faster (aerobic exercise) most days of the week. Activities may include walking, swimming, or biking.  Include exercise to strengthen your muscles (resistance exercise), such as pilates or lifting weights, as part of your weekly exercise routine. Try to do these types of exercises for 30 minutes at least 3 days a week.  Do not use any products that contain nicotine or tobacco, such as cigarettes and e-cigarettes. If you need help quitting, ask your health care provider.  Monitor your blood pressure at home as told by your health care provider.  Keep all follow-up visits as told by your health care provider. This is important. Medicines  Take over-the-counter and prescription medicines only as told by your health  care provider. Follow directions carefully. Blood pressure medicines must be taken as prescribed.  Do not skip doses of blood pressure medicine. Doing this puts you at risk for problems and can make the medicine less effective.  Ask your health care provider about side effects or reactions to medicines that you should watch for. Contact a health care provider if:  You think you are having a reaction to a medicine you are taking.  You have headaches that keep coming back (recurring).  You feel dizzy.  You have swelling in your ankles.  You have trouble with your vision. Get help right away if:  You develop a severe headache or confusion.  You have unusual weakness or numbness.  You feel faint.  You have severe pain in your chest or abdomen.  You vomit repeatedly.  You have trouble breathing. Summary  Hypertension is when the force of blood pumping through your arteries is too strong. If this condition is not controlled, it may put you at risk for  serious complications.  Your personal target blood pressure may vary depending on your medical conditions, your age, and other factors. For most people, a normal blood pressure is less than 120/80.  Hypertension is treated with lifestyle changes, medicines, or a combination of both. Lifestyle changes include weight loss, eating a healthy, low-sodium diet, exercising more, and limiting alcohol. This information is not intended to replace advice given to you by your health care provider. Make sure you discuss any questions you have with your health care provider. Document Released: 03/22/2005 Document Revised: 02/18/2016 Document Reviewed: 02/18/2016 Elsevier Interactive Patient Education  2019 [employer]. Carvedilol oral capsule, extended release What is this medicine? CARVEDILOL (KAR ve dil ol) is a beta-blocker. Beta-blockers reduce the workload on the heart and help it beat more regularly. This medicine is used to treat high blood pressure, heart failure, and heart problems after a heart attack. This medicine may be used for other purposes; ask your health care provider or pharmacist if you have questions. COMMON BRAND NAME(S): Coreg CR What should I tell my health care provider before I take this medicine? They need to know if you have any of these conditions: -circulation problems -diabetes -history of heart attack or heart disease -liver disease -lung or breathing disease, like asthma or emphysema -pheochromocytoma -slow or irregular heartbeat -thyroid disease -an unusual or allergic reaction to carvedilol, other beta-blockers, medicines, foods, dyes, or preservatives -pregnant or trying to get pregnant -breast-feeding How should I use this medicine? Take this medicine by mouth with a glass of water. Follow the directions on the prescription label. Take with food. Do not cut, crush or chew this medicine. Take your doses at regular intervals. Do not take your medicine more often  than directed. Do not stop taking except on your doctor's advice. Talk to your pediatrician regarding the use of this medicine in children. Special care may be needed. Overdosage: If you think you have taken too much of this medicine contact a poison control center or emergency room at once. NOTE: This medicine is only for you. Do not share this medicine with others. What if I miss a dose? If you miss a dose, take it as soon as you can. If it is almost time for your next dose, take only that dose. Do not take double or extra doses. What may interact with this medicine? This medicine may interact with the following medications: -certain medicines for blood pressure, heart disease, irregular heart beat -certain medicines  for depression, like fluoxetine or paroxetine -certain medicines for diabetes, like glipizide or glyburide -cimetidine -clonidine -cyclosporine -digoxin -MAOIs like Carbex, Eldepryl, Marplan, Nardil, and Parnate -reserpine -rifampin This list may not describe all possible interactions. Give your health care provider a list of all the medicines, herbs, non-prescription drugs, or dietary supplements you use. Also tell them if you smoke, drink alcohol, or use illegal drugs. Some items may interact with your medicine. What should I watch for while using this medicine? Check your heart rate and blood pressure regularly while you are taking this medicine. Ask your doctor or health care professional what your heart rate and blood pressure should be, and when you should contact him or her. Do not stop taking this medicine suddenly. This could lead to serious heart-related effects. Contact your doctor or health care professional if you have difficulty breathing while taking this drug. Check your weight daily. Ask your doctor or health care professional when you should notify him/her of any weight gain. You may get drowsy or dizzy. Do not drive, use machinery, or do anything that requires  mental alertness until you know how this medicine affects you. To reduce the risk of dizzy or fainting spells, do not sit or stand up quickly. Alcohol can make you more drowsy, and increase flushing and rapid heartbeats. Avoid alcoholic drinks. If you have diabetes, check your blood sugar as directed. Tell your doctor if you have changes in your blood sugar while you are taking this medicine. If you are going to have surgery, tell your doctor or health care professional that you are taking this medicine. What side effects may I notice from receiving this medicine? Side effects that you should report to your doctor or health care professional as soon as possible: -allergic reactions like skin rash, itching or hives, swelling of the face, lips, or tongue -breathing problems -dark urine -slow or irregular heartbeat -swollen legs or ankles -vomiting -yellowing of the eyes or skin Side effects that usually do not require medical attention (report to your doctor or health care professional if they continue or are bothersome): -change in sex drive or performance -diarrhea -dry eyes (especially if wearing contact lenses) -dry, itching skin -headache -nausea -unusually tired This list may not describe all possible side effects. Call your doctor for medical advice about side effects. You may report side effects to FDA at 1-800-FDA-1088. Where should I keep my medicine? Keep out of the reach of children. Store at room temperature between 15 and 30 degrees C (59 and 86 degrees F). Protect from moisture. Keep container tightly closed. Throw away any unused medicine after the expiration date. NOTE: This sheet is a summary. It may not cover all possible information. If you have questions about this medicine, talk to your doctor, pharmacist, or health care provider.  2019 Elsevier/Gold Standard (2012-11-26 14:07:32) Lorazepam tablets What is this medicine? LORAZEPAM (lor A ze pam) is a benzodiazepine.  It is used to treat anxiety. This medicine may be used for other purposes; ask your health care provider or pharmacist if you have questions. COMMON BRAND NAME(S): Ativan What should I tell my health care provider before I take this medicine? They need to know if you have any of these conditions: -glaucoma -history of drug or alcohol abuse problem -kidney disease -liver disease -lung or breathing disease, like asthma -mental illness -myasthenia gravis -Parkinson's disease -suicidal thoughts, plans, or attempt; a previous suicide attempt by you or a family member -an unusual or allergic reaction to  lorazepam, other medicines, foods, dyes, or preservatives -pregnant or trying to get pregnant -breast-feeding How should I use this medicine? Take this medicine by mouth with a glass of water. Follow the directions on the prescription label. Take your medicine at regular intervals. Do not take it more often than directed. Do not stop taking except on your doctor's advice. A special MedGuide will be given to you by the pharmacist with each prescription and refill. Be sure to read this information carefully each time. Talk to your pediatrician regarding the use of this medicine in children. While this drug may be used in children as young as 12 years for selected conditions, precautions do apply. Overdosage: If you think you have taken too much of this medicine contact a poison control center or emergency room at once. NOTE: This medicine is only for you. Do not share this medicine with others. What if I miss a dose? If you miss a dose, take it as soon as you can. If it is almost time for your next dose, take only that dose. Do not take double or extra doses. What may interact with this medicine? Do not take this medicine with any of the following medications: -narcotic medicines for cough -sodium oxybate This medicine may also interact with the following medications: -alcohol -antihistamines  for allergy, cough and cold -certain medicines for anxiety or sleep -certain medicines for depression, like amitriptyline, fluoxetine, sertraline -certain medicines for seizures like carbamazepine, phenobarbital, phenytoin, primidone -general anesthetics like lidocaine, pramoxine, tetracaine -MAOIs like Carbex, Eldepryl, Marplan, Nardil, and Parnate -medicines that relax muscles for surgery -narcotic medicines for pain -phenothiazines like chlorpromazine, mesoridazine, prochlorperazine, thioridazine This list may not describe all possible interactions. Give your health care provider a list of all the medicines, herbs, non-prescription drugs, or dietary supplements you use. Also tell them if you smoke, drink alcohol, or use illegal drugs. Some items may interact with your medicine. What should I watch for while using this medicine? Tell your doctor or health care professional if your symptoms do not start to get better or if they get worse. Do not stop taking except on your doctor's advice. You may develop a severe reaction. Your doctor will tell you how much medicine to take. You may get drowsy or dizzy. Do not drive, use machinery, or do anything that needs mental alertness until you know how this medicine affects you. To reduce the risk of dizzy and fainting spells, do not stand or sit up quickly, especially if you are an older patient. Alcohol may increase dizziness and drowsiness. Avoid alcoholic drinks. If you are taking another medicine that also causes drowsiness, you may have more side effects. Give your health care provider a list of all medicines you use. Your doctor will tell you how much medicine to take. Do not take more medicine than directed. Call emergency for help if you have problems breathing or unusual sleepiness. What side effects may I notice from receiving this medicine? Side effects that you should report to your doctor or health care professional as soon as  possible: -allergic reactions like skin rash, itching or hives, swelling of the face, lips, or tongue -breathing problems -confusion -loss of balance or coordination -signs and symptoms of low blood pressure like dizziness; feeling faint or lightheaded, falls; unusually weak or tired -suicidal thoughts or other mood changes Side effects that usually do not require medical attention (report to your doctor or health care professional if they continue or are bothersome): -dizziness -headache -nausea,  vomiting -tiredness This list may not describe all possible side effects. Call your doctor for medical advice about side effects. You may report side effects to FDA at 1-800-FDA-1088. Where should I keep my medicine? Keep out of the reach of children. This medicine can be abused. Keep your medicine in a safe place to protect it from theft. Do not share this medicine with anyone. Selling or giving away this medicine is dangerous and against the law. This medicine may cause accidental overdose and death if taken by other adults, children, or pets. Mix any unused medicine with a substance like cat litter or coffee grounds. Then throw the medicine away in a sealed container like a sealed bag or a coffee can with a lid. Do not use the medicine after the expiration date. Store at room temperature between 20 and 25 degrees C (68 and 77 degrees F). Protect from light. Keep container tightly closed. NOTE: This sheet is a summary. It may not cover all possible information. If you have questions about this medicine, talk to your doctor, pharmacist, or health care provider.  2019 Elsevier/Gold Standard (2014-12-19 15:54:27)

## 2018-04-05 LAB — GAMMA GT: GGT: 20 U/L (ref 3–70)

## 2018-04-05 LAB — CBC
HEMATOCRIT: 41.9 % (ref 38.5–50.0)
Hemoglobin: 14.6 g/dL (ref 13.2–17.1)
MCH: 32.4 pg (ref 27.0–33.0)
MCHC: 34.8 g/dL (ref 32.0–36.0)
MCV: 92.9 fL (ref 80.0–100.0)
MPV: 10 fL (ref 7.5–12.5)
Platelets: 283 10*3/uL (ref 140–400)
RBC: 4.51 10*6/uL (ref 4.20–5.80)
RDW: 12.4 % (ref 11.0–15.0)
WBC: 7.4 10*3/uL (ref 3.8–10.8)

## 2018-04-05 LAB — COMPREHENSIVE METABOLIC PANEL
AG RATIO: 1.9 (calc) (ref 1.0–2.5)
ALBUMIN MSPROF: 4.7 g/dL (ref 3.6–5.1)
ALT: 19 U/L (ref 9–46)
AST: 15 U/L (ref 10–35)
Alkaline phosphatase (APISO): 58 U/L (ref 40–115)
BUN: 13 mg/dL (ref 7–25)
CO2: 24 mmol/L (ref 20–32)
Calcium: 9.6 mg/dL (ref 8.6–10.3)
Chloride: 105 mmol/L (ref 98–110)
Creat: 0.71 mg/dL (ref 0.70–1.25)
GLOBULIN: 2.5 g/dL (ref 1.9–3.7)
Glucose, Bld: 114 mg/dL — ABNORMAL HIGH (ref 65–99)
POTASSIUM: 4.2 mmol/L (ref 3.5–5.3)
Sodium: 139 mmol/L (ref 135–146)
TOTAL PROTEIN: 7.2 g/dL (ref 6.1–8.1)
Total Bilirubin: 1.2 mg/dL (ref 0.2–1.2)

## 2018-04-11 ENCOUNTER — Telehealth: Payer: Self-pay | Admitting: Family Medicine

## 2018-04-11 NOTE — Telephone Encounter (Signed)
I called and spoke with patient. We went over his results in detail & he had no further questions at this time.

## 2018-04-11 NOTE — Telephone Encounter (Signed)
Copied from [place] [phone]. Topic: Quick Communication - See Telephone Encounter >> Apr 11, 2018 10:49 AM Bea Graff, NT wrote: CRM for notification. See Telephone encounter for: 04/11/18. Pt calling back to get the actual reading of his cholesterol level and a few other labs. Please advise.

## 2018-04-21 ENCOUNTER — Encounter: Payer: Self-pay | Admitting: Family Medicine

## 2018-04-21 ENCOUNTER — Ambulatory Visit (INDEPENDENT_AMBULATORY_CARE_PROVIDER_SITE_OTHER): Payer: 59 | Admitting: Family Medicine

## 2018-04-21 VITALS — BP 138/82 | HR 99 | Ht 74.0 in

## 2018-04-21 DIAGNOSIS — G4700 Insomnia, unspecified: Secondary | ICD-10-CM | POA: Diagnosis not present

## 2018-04-21 DIAGNOSIS — I10 Essential (primary) hypertension: Secondary | ICD-10-CM | POA: Diagnosis not present

## 2018-04-21 MED ORDER — TRAZODONE HCL 50 MG PO TABS: 25.0000 mg | ORAL_TABLET | Freq: Every evening | ORAL | 1 refills | Status: DC | PRN

## 2018-04-21 NOTE — Patient Instructions (Signed)
Insomnia Insomnia is a sleep disorder that makes it difficult to fall asleep or stay asleep. Insomnia can cause fatigue, low energy, difficulty concentrating, mood swings, and poor performance at work or school. There are three different ways to classify insomnia:  Difficulty falling asleep.  Difficulty staying asleep.  Waking up too early in the morning. Any type of insomnia can be long-term (chronic) or short-term (acute). Both are common. Short-term insomnia usually lasts for three months or less. Chronic insomnia occurs at least three times a week for longer than three months. What are the causes? Insomnia may be caused by another condition, situation, or substance, such as:  Anxiety.  Certain medicines.  Gastroesophageal reflux disease (GERD) or other gastrointestinal conditions.  Asthma or other breathing conditions.  Restless legs syndrome, sleep apnea, or other sleep disorders.  Chronic pain.  Menopause.  Stroke.  Abuse of alcohol, tobacco, or illegal drugs.  Mental health conditions, such as depression.  Caffeine.  Neurological disorders, such as Alzheimer's disease.  An overactive thyroid (hyperthyroidism). Sometimes, the cause of insomnia may not be known. What increases the risk? Risk factors for insomnia include:  Gender. Women are affected more often than men.  Age. Insomnia is more common as you get older.  Stress.  Lack of exercise.  Irregular work schedule or working night shifts.  Traveling between different time zones.  Certain medical and mental health conditions. What are the signs or symptoms? If you have insomnia, the main symptom is having trouble falling asleep or having trouble staying asleep. This may lead to other symptoms, such as:  Feeling fatigued or having low energy.  Feeling nervous about going to sleep.  Not feeling rested in the morning.  Having trouble concentrating.  Feeling irritable, anxious, or depressed. How  is this diagnosed? This condition may be diagnosed based on:  Your symptoms and medical history. Your health care provider may ask about: ? Your sleep habits. ? Any medical conditions you have. ? Your mental health.  A physical exam. How is this treated? Treatment for insomnia depends on the cause. Treatment may focus on treating an underlying condition that is causing insomnia. Treatment may also include:  Medicines to help you sleep.  Counseling or therapy.  Lifestyle adjustments to help you sleep better. Follow these instructions at home: Eating and drinking   Limit or avoid alcohol, caffeinated beverages, and cigarettes, especially close to bedtime. These can disrupt your sleep.  Do not eat a large meal or eat spicy foods right before bedtime. This can lead to digestive discomfort that can make it hard for you to sleep. Sleep habits   Keep a sleep diary to help you and your health care provider figure out what could be causing your insomnia. Write down: ? When you sleep. ? When you wake up during the night. ? How well you sleep. ? How rested you feel the next day. ? Any side effects of medicines you are taking. ? What you eat and drink.  Make your bedroom a dark, comfortable place where it is easy to fall asleep. ? Put up shades or blackout curtains to block light from outside. ? Use a white noise machine to block noise. ? Keep the temperature cool.  Limit screen use before bedtime. This includes: ? Watching TV. ? Using your smartphone, tablet, or computer.  Stick to a routine that includes going to bed and waking up at the same times every day and night. This can help you fall asleep faster. Consider   to block light from outside.  ? Use a white noise machine to block noise.  ? Keep the temperature cool.  · Limit screen use before bedtime. This includes:  ? Watching TV.  ? Using your smartphone, tablet, or computer.  · Stick to a routine that includes going to bed and waking up at the same times every day and night. This can help you fall asleep faster. Consider making a quiet activity, such as reading, part of your nighttime routine.  · Try to avoid taking naps during the day so that you sleep better at night.  · Get out of bed if you are still awake after 15 minutes of trying to sleep. Keep the lights down, but try reading or  doing a quiet activity. When you feel sleepy, go back to bed.  General instructions  · Take over-the-counter and prescription medicines only as told by your health care provider.  · Exercise regularly, as told by your health care provider. Avoid exercise starting several hours before bedtime.  · Use relaxation techniques to manage stress. Ask your health care provider to suggest some techniques that may work well for you. These may include:  ? Breathing exercises.  ? Routines to release muscle tension.  ? Visualizing peaceful scenes.  · Make sure that you drive carefully. Avoid driving if you feel very sleepy.  · Keep all follow-up visits as told by your health care provider. This is important.  Contact a health care provider if:  · You are tired throughout the day.  · You have trouble in your daily routine due to sleepiness.  · You continue to have sleep problems, or your sleep problems get worse.  Get help right away if:  · You have serious thoughts about hurting yourself or someone else.  If you ever feel like you may hurt yourself or others, or have thoughts about taking your own life, get help right away. You can go to your nearest emergency department or call:  · Your local emergency services (911 in the [place]  · A suicide crisis helpline, such as the National Suicide Prevention Lifeline at 1-800-273-8255. This is open 24 hours a day.  Summary  · Insomnia is a sleep disorder that makes it difficult to fall asleep or stay asleep.  · Insomnia can be long-term (chronic) or short-term (acute).  · Treatment for insomnia depends on the cause. Treatment may focus on treating an underlying condition that is causing insomnia.  · Keep a sleep diary to help you and your health care provider figure out what could be causing your insomnia.  This information is not intended to replace advice given to you by your health care provider. Make sure you discuss any questions you have with your health care provider.  Document  Released: 03/19/2000 Document Revised: 12/30/2016 Document Reviewed: 12/30/2016  Elsevier Interactive Patient Education © 2019 [employer]  Hypertension  Hypertension, commonly called high blood pressure, is when the force of blood pumping through the arteries is too strong. The arteries are the blood vessels that carry blood from the heart throughout the body. Hypertension forces the heart to work harder to pump blood and may cause arteries to become narrow or stiff. Having untreated or uncontrolled hypertension can cause heart attacks, strokes, kidney disease, and other problems.  A blood pressure reading consists of a higher number over a lower number. Ideally, your blood pressure should be below 120/80. The first ("top") number is called the systolic pressure. It is   a measure of the pressure in your arteries as your heart beats. The second ("bottom") number is called the diastolic pressure. It is a measure of the pressure in your arteries as the heart relaxes.  What are the causes?  The cause of this condition is not known.  What increases the risk?  Some risk factors for high blood pressure are under your control. Others are not.  Factors you can change  · Smoking.  · Having type 2 diabetes mellitus, high cholesterol, or both.  · Not getting enough exercise or physical activity.  · Being overweight.  · Having too much fat, sugar, calories, or salt (sodium) in your diet.  · Drinking too much alcohol.  Factors that are difficult or impossible to change  · Having chronic kidney disease.  · Having a family history of high blood pressure.  · Age. Risk increases with age.  · Race. You may be at higher risk if you are African-American.  · Gender. Men are at higher risk than women before age 45. After age 65, women are at higher risk than men.  · Having obstructive sleep apnea.  · Stress.  What are the signs or symptoms?  Extremely high blood pressure (hypertensive crisis) may  cause:  · Headache.  · Anxiety.  · Shortness of breath.  · Nosebleed.  · Nausea and vomiting.  · Severe chest pain.  · Jerky movements you cannot control (seizures).  How is this diagnosed?  This condition is diagnosed by measuring your blood pressure while you are seated, with your arm resting on a surface. The cuff of the blood pressure monitor will be placed directly against the skin of your upper arm at the level of your heart. It should be measured at least twice using the same arm. Certain conditions can cause a difference in blood pressure between your right and left arms.  Certain factors can cause blood pressure readings to be lower or higher than normal (elevated) for a short period of time:  · When your blood pressure is higher when you are in a health care provider's office than when you are at home, this is called white coat hypertension. Most people with this condition do not need medicines.  · When your blood pressure is higher at home than when you are in a health care provider's office, this is called masked hypertension. Most people with this condition may need medicines to control blood pressure.  If you have a high blood pressure reading during one visit or you have normal blood pressure with other risk factors:  · You may be asked to return on a different day to have your blood pressure checked again.  · You may be asked to monitor your blood pressure at home for 1 week or longer.  If you are diagnosed with hypertension, you may have other blood or imaging tests to help your health care provider understand your overall risk for other conditions.  How is this treated?  This condition is treated by making healthy lifestyle changes, such as eating healthy foods, exercising more, and reducing your alcohol intake. Your health care provider may prescribe medicine if lifestyle changes are not enough to get your blood pressure under control, and if:  · Your systolic blood pressure is above 130.  · Your  diastolic blood pressure is above 80.  Your personal target blood pressure may vary depending on your medical conditions, your age, and other factors.  Follow these instructions at home:    Eating and drinking    · Eat a diet that is high in fiber and potassium, and low in sodium, added sugar, and fat. An example eating plan is called the DASH (Dietary Approaches to Stop Hypertension) diet. To eat this way:  ? Eat plenty of fresh fruits and vegetables. Try to fill half of your plate at each meal with fruits and vegetables.  ? Eat whole grains, such as whole wheat pasta, brown rice, or whole grain bread. Fill about one quarter of your plate with whole grains.  ? Eat or drink low-fat dairy products, such as skim milk or low-fat yogurt.  ? Avoid fatty cuts of meat, processed or cured meats, and poultry with skin. Fill about one quarter of your plate with lean proteins, such as fish, chicken without skin, beans, eggs, and tofu.  ? Avoid premade and processed foods. These tend to be higher in sodium, added sugar, and fat.  · Reduce your daily sodium intake. Most people with hypertension should eat less than 1,500 mg of sodium a day.  · Limit alcohol intake to no more than 1 drink a day for nonpregnant women and 2 drinks a day for men. One drink equals 12 oz of beer, 5 oz of wine, or 1½ oz of hard liquor.  Lifestyle    · Work with your health care provider to maintain a healthy body weight or to lose weight. Ask what an ideal weight is for you.  · Get at least 30 minutes of exercise that causes your heart to beat faster (aerobic exercise) most days of the week. Activities may include walking, swimming, or biking.  · Include exercise to strengthen your muscles (resistance exercise), such as pilates or lifting weights, as part of your weekly exercise routine. Try to do these types of exercises for 30 minutes at least 3 days a week.  · Do not use any products that contain nicotine or tobacco, such as cigarettes and  e-cigarettes. If you need help quitting, ask your health care provider.  · Monitor your blood pressure at home as told by your health care provider.  · Keep all follow-up visits as told by your health care provider. This is important.  Medicines  · Take over-the-counter and prescription medicines only as told by your health care provider. Follow directions carefully. Blood pressure medicines must be taken as prescribed.  · Do not skip doses of blood pressure medicine. Doing this puts you at risk for problems and can make the medicine less effective.  · Ask your health care provider about side effects or reactions to medicines that you should watch for.  Contact a health care provider if:  · You think you are having a reaction to a medicine you are taking.  · You have headaches that keep coming back (recurring).  · You feel dizzy.  · You have swelling in your ankles.  · You have trouble with your vision.  Get help right away if:  · You develop a severe headache or confusion.  · You have unusual weakness or numbness.  · You feel faint.  · You have severe pain in your chest or abdomen.  · You vomit repeatedly.  · You have trouble breathing.  Summary  · Hypertension is when the force of blood pumping through your arteries is too strong. If this condition is not controlled, it may put you at risk for serious complications.  · Your personal target blood pressure may vary depending on your medical

## 2018-04-21 NOTE — Progress Notes (Signed)
Established Patient Office Visit  Subjective:  Patient ID: Jacob Rodriguez, male    DOB: June 09, 1955  Age: 63 y.o. MRN: [id number]  CC:  Chief Complaint  Patient presents with  . Follow-up    HPI Jacob Rodriguez presents for follow-up of his hypertension and adjustment disorder.  Things have settled down with the Internet hoax he is no longer requiring use of the lorazepam.  He is tolerating the carvedilol well.  He denies any issues taking the medicine.  He would like to try something for sleep.  He has issues falling asleep and staying asleep.  He has taken Ambien in the past.  Is scheduled to have a knee arthroscopy on Wednesday.  This should be okay for him since he started the carvedilol more than 2 weeks ago.  History reviewed. No pertinent past medical history.  Past Surgical History:  Procedure Laterality Date  . KNEE ARTHROSCOPY     rt x3  . ROTATOR CUFF REPAIR      Family History  Problem Relation Age of Onset  . Colon cancer Neg Hx     Social History   Socioeconomic History  . Marital status: Married    Spouse name: Not on file  . Number of children: Not on file  . Years of education: Not on file  . Highest education level: Not on file  Occupational History  . Not on file  Social Needs  . Financial resource strain: Not on file  . Food insecurity:    Worry: Not on file    Inability: Not on file  . Transportation needs:    Medical: Not on file    Non-medical: Not on file  Tobacco Use  . Smoking status: Never Smoker  . Smokeless tobacco: Never Used  Substance and Sexual Activity  . Alcohol use: Yes    Alcohol/week: 7.0 standard drinks    Types: 7 Standard drinks or equivalent per week  . Drug use: No  . Sexual activity: Not on file  Lifestyle  . Physical activity:    Days per week: Not on file    Minutes per session: Not on file  . Stress: Not on file  Relationships  . Social connections:    Talks on phone: Not on file    Gets together: Not on  file    Attends religious service: Not on file    Active member of club or organization: Not on file    Attends meetings of clubs or organizations: Not on file    Relationship status: Not on file  . Intimate partner violence:    Fear of current or ex partner: Not on file    Emotionally abused: Not on file    Physically abused: Not on file    Forced sexual activity: Not on file  Other Topics Concern  . Not on file  Social History Narrative  . Not on file    Outpatient Medications Prior to Visit  Medication Sig Dispense Refill  . carvedilol (COREG CR) 20 MG 24 hr capsule Take 1 capsule (20 mg total) by mouth at bedtime. 30 capsule 2  . glucosamine-chondroitin 500-400 MG tablet Take 1 tablet by mouth daily.    . Multiple Vitamin (MULTIVITAMIN) tablet Take 1 tablet by mouth daily.    . TURMERIC PO Take 1 tablet by mouth daily.    Marland Kitchen VITAMIN D PO Take 1 tablet by mouth daily.    Marland Kitchen LORazepam (ATIVAN) 0.5 MG tablet Take 1 tablet (  0.5 mg total) by mouth 2 (two) times daily. AS NEEDED. 30 tablet 0   No facility-administered medications prior to visit.     No Known Allergies  ROS Review of Systems  Constitutional: Negative for chills, diaphoresis, fatigue, fever and unexpected weight change.  HENT: Negative.   Eyes: Negative.   Respiratory: Negative.   Cardiovascular: Negative.  Negative for chest pain and palpitations.  Genitourinary: Negative.   Skin: Negative for pallor and rash.  Neurological: Negative for headaches.  Hematological: Does not bruise/bleed easily.  Psychiatric/Behavioral: Positive for sleep disturbance.      Objective:    Physical Exam  Constitutional: He is oriented to person, place, and time. He appears well-developed and well-nourished. No distress.  HENT:  Head: Normocephalic and atraumatic.  Right Ear: External ear normal.  Left Ear: External ear normal.  Eyes: Right eye exhibits no discharge. Left eye exhibits no discharge. No scleral icterus.    Neck: Neck supple. No JVD present. No tracheal deviation present.  Pulmonary/Chest: Effort normal. No stridor.  Neurological: He is alert and oriented to person, place, and time.  Skin: Skin is warm and dry. He is not diaphoretic.  Psychiatric: He has a normal mood and affect. His behavior is normal.    BP 138/82   Pulse 99   Ht 6\' 2"  (1.88 m)   SpO2 98%   BMI 28.29 kg/m  Wt Readings from Last 3 Encounters:  04/04/18 220 lb 6 oz (100 kg)  03/01/16 210 lb (95.3 kg)  02/16/16 210 lb (95.3 kg)   BP Readings from Last 3 Encounters:  04/21/18 138/82  04/04/18 (!) 170/94  03/01/16 (!) 146/77   Guideline developer:  UpToDate (see UpToDate for funding source) Date Released: June 2014  Health Maintenance Due  Topic Date Due  . Hepatitis C Screening  Oct 13, 1955  . HIV Screening  12/10/1971  . TETANUS/TDAP  12/10/1975  . INFLUENZA VACCINE  11/03/2017    There are no preventive care reminders to display for this patient.  No results found for: TSH Lab Results  Component Value Date   WBC 7.4 04/04/2018   HGB 14.6 04/04/2018   HCT 41.9 04/04/2018   MCV 92.9 04/04/2018   PLT 283 04/04/2018   Lab Results  Component Value Date   NA 139 04/04/2018   K 4.2 04/04/2018   CO2 24 04/04/2018   GLUCOSE 114 (H) 04/04/2018   BUN 13 04/04/2018   CREATININE 0.71 04/04/2018   BILITOT 1.2 04/04/2018   AST 15 04/04/2018   ALT 19 04/04/2018   PROT 7.2 04/04/2018   CALCIUM 9.6 04/04/2018   ANIONGAP 7 08/06/2014   No results found for: CHOL No results found for: HDL No results found for: LDLCALC No results found for: TRIG No results found for: CHOLHDL No results found for: HGBA1C    Assessment & Plan:   Problem List Items Addressed This Visit      Cardiovascular and Mediastinum   Essential hypertension - Primary    Other Visit Diagnoses    Insomnia, unspecified type       Relevant Medications   traZODone (DESYREL) 50 MG tablet      Meds ordered this encounter   Medications  . traZODone (DESYREL) 50 MG tablet    Sig: Take 0.5-1 tablets (25-50 mg total) by mouth at bedtime as needed for sleep.    Dispense:  30 tablet    Refill:  1    Follow-up: Return return for physical exam fasting.  We will continue carvedilol daily.  He will try trazodone as needed at night for sleep.  We will schedule visit for a physical exam fasting.  Should be okay for surgery this coming Wednesday on his knee.  He will inform anesthesia that he just started carvedilol but it has been greater than 2 weeks before the upcoming surgery.

## 2018-05-26 ENCOUNTER — Encounter: Payer: 59 | Admitting: Family Medicine

## 2018-06-03 ENCOUNTER — Encounter: Payer: Self-pay | Admitting: Internal Medicine

## 2018-06-06 ENCOUNTER — Ambulatory Visit (INDEPENDENT_AMBULATORY_CARE_PROVIDER_SITE_OTHER): Payer: 59 | Admitting: Family Medicine

## 2018-06-06 ENCOUNTER — Encounter: Payer: Self-pay | Admitting: Family Medicine

## 2018-06-06 VITALS — BP 128/80 | HR 70 | Ht 74.0 in | Wt 224.4 lb

## 2018-06-06 DIAGNOSIS — Z0001 Encounter for general adult medical examination with abnormal findings: Secondary | ICD-10-CM | POA: Diagnosis not present

## 2018-06-06 DIAGNOSIS — I10 Essential (primary) hypertension: Secondary | ICD-10-CM

## 2018-06-06 DIAGNOSIS — F5102 Adjustment insomnia: Secondary | ICD-10-CM

## 2018-06-06 LAB — CBC
HCT: 40.4 % (ref 39.0–52.0)
Hemoglobin: 13.8 g/dL (ref 13.0–17.0)
MCHC: 34.1 g/dL (ref 30.0–36.0)
MCV: 95.3 fl (ref 78.0–100.0)
Platelets: 298 10*3/uL (ref 150.0–400.0)
RBC: 4.24 Mil/uL (ref 4.22–5.81)
RDW: 13.3 % (ref 11.5–15.5)
WBC: 5.8 10*3/uL (ref 4.0–10.5)

## 2018-06-06 LAB — COMPREHENSIVE METABOLIC PANEL
ALBUMIN: 4.3 g/dL (ref 3.5–5.2)
ALK PHOS: 59 U/L (ref 39–117)
ALT: 22 U/L (ref 0–53)
AST: 14 U/L (ref 0–37)
BUN: 16 mg/dL (ref 6–23)
CO2: 26 mEq/L (ref 19–32)
CREATININE: 0.63 mg/dL (ref 0.40–1.50)
Calcium: 9.2 mg/dL (ref 8.4–10.5)
Chloride: 105 mEq/L (ref 96–112)
GFR: 129.26 mL/min (ref >60.00)
Glucose, Bld: 97 mg/dL (ref 70–99)
Potassium: 4.8 mEq/L (ref 3.5–5.1)
Sodium: 138 mEq/L (ref 135–145)
Total Bilirubin: 1.3 mg/dL — ABNORMAL HIGH (ref 0.2–1.2)
Total Protein: 6.4 g/dL (ref 6.0–8.3)

## 2018-06-06 LAB — LIPID PANEL
CHOLESTEROL: 192 mg/dL (ref 0–200)
HDL: 59.1 mg/dL (ref >39.00)
LDL Cholesterol: 112 mg/dL — ABNORMAL HIGH (ref 0–99)
NonHDL: 132.9
Total CHOL/HDL Ratio: 3
Triglycerides: 105 mg/dL (ref 0.0–149.0)
VLDL: 21 mg/dL (ref 0.0–40.0)

## 2018-06-06 LAB — URINALYSIS, ROUTINE W REFLEX MICROSCOPIC
Bilirubin Urine: NEGATIVE
Hgb urine dipstick: NEGATIVE
Ketones, ur: NEGATIVE
LEUKOCYTE UA: NEGATIVE
Nitrite: NEGATIVE
PH: 7 (ref 5.0–8.0)
RBC / HPF: NONE SEEN (ref 0–?)
Specific Gravity, Urine: <=1.005 — AB (ref 1.000–1.030)
Total Protein, Urine: NEGATIVE
Urine Glucose: NEGATIVE
Urobilinogen, UA: 0.2 (ref 0.0–1.0)
WBC, UA: NONE SEEN (ref 0–?)

## 2018-06-06 LAB — PSA: PSA: 0.74 ng/mL (ref 0.10–4.00)

## 2018-06-06 MED ORDER — CARVEDILOL PHOSPHATE ER 20 MG PO CP24: 20.0000 mg | ORAL_CAPSULE | Freq: Every day | ORAL | 3 refills | Status: DC

## 2018-06-06 MED ORDER — ZOLPIDEM TARTRATE 5 MG PO TABS: 5.0000 mg | ORAL_TABLET | Freq: Every evening | ORAL | 1 refills | Status: DC | PRN

## 2018-06-06 NOTE — Patient Instructions (Signed)

## 2018-06-06 NOTE — Progress Notes (Signed)
Established Patient Office Visit  Subjective:  Patient ID: Jacob Rodriguez, male    DOB: 1955/04/19  Age: 63 y.o. MRN: [id number]  CC:  Chief Complaint  Patient presents with  . Annual Exam    HPI Jacob Rodriguez presents for for complete physical exam and follow-up on his hypertension that is been well controlled with the carvedilol.  Patient is tolerating the drug well and denies any specific side effects to it.  Patient did not tolerate the trazodone for sleep.  He tells that he is used Ambien in the past sparingly.  Patient travels with his job across several times zones and overseas.  Tells me prescription of 30 Ambien it lasted him 6 months.  Denies any sleep aberrancy while taking that medication.  History reviewed. No pertinent past medical history.  Past Surgical History:  Procedure Laterality Date  . KNEE ARTHROSCOPY     rt x3  . ROTATOR CUFF REPAIR      Family History  Problem Relation Age of Onset  . Colon cancer Neg Hx     Social History   Socioeconomic History  . Marital status: Married    Spouse name: Not on file  . Number of children: Not on file  . Years of education: Not on file  . Highest education level: Not on file  Occupational History  . Not on file  Social Needs  . Financial resource strain: Not on file  . Food insecurity:    Worry: Not on file    Inability: Not on file  . Transportation needs:    Medical: Not on file    Non-medical: Not on file  Tobacco Use  . Smoking status: Never Smoker  . Smokeless tobacco: Never Used  Substance and Sexual Activity  . Alcohol use: Yes    Alcohol/week: 7.0 standard drinks    Types: 7 Standard drinks or equivalent per week  . Drug use: No  . Sexual activity: Not on file  Lifestyle  . Physical activity:    Days per week: Not on file    Minutes per session: Not on file  . Stress: Not on file  Relationships  . Social connections:    Talks on phone: Not on file    Gets together: Not on file   Attends religious service: Not on file    Active member of club or organization: Not on file    Attends meetings of clubs or organizations: Not on file    Relationship status: Not on file  . Intimate partner violence:    Fear of current or ex partner: Not on file    Emotionally abused: Not on file    Physically abused: Not on file    Forced sexual activity: Not on file  Other Topics Concern  . Not on file  Social History Narrative  . Not on file    Outpatient Medications Prior to Visit  Medication Sig Dispense Refill  . glucosamine-chondroitin 500-400 MG tablet Take 1 tablet by mouth daily.    . Multiple Vitamin (MULTIVITAMIN) tablet Take 1 tablet by mouth daily.    . TURMERIC PO Take 1 tablet by mouth daily.    Marland Kitchen VITAMIN D PO Take 1 tablet by mouth daily.    . carvedilol (COREG CR) 20 MG 24 hr capsule Take 1 capsule (20 mg total) by mouth at bedtime. 30 capsule 2  . traZODone (DESYREL) 50 MG tablet Take 0.5-1 tablets (25-50 mg total) by mouth at bedtime as  needed for sleep. 30 tablet 1   No facility-administered medications prior to visit.     No Known Allergies  ROS Review of Systems  Constitutional: Negative.   HENT: Negative.   Eyes: Negative for photophobia and visual disturbance.  Respiratory: Negative.   Cardiovascular: Negative.   Gastrointestinal: Negative.   Endocrine: Negative for polyphagia and polyuria.  Genitourinary: Negative for difficulty urinating and frequency.  Musculoskeletal: Negative for gait problem and joint swelling.  Skin: Negative for pallor and rash.  Allergic/Immunologic: Negative for immunocompromised state.  Neurological: Negative for seizures and headaches.  Hematological: Does not bruise/bleed easily.  Psychiatric/Behavioral: Positive for sleep disturbance.      Objective:    Physical Exam  Constitutional: He is oriented to person, place, and time. He appears well-developed and well-nourished. No distress.  HENT:  Head:  Normocephalic and atraumatic.  Right Ear: External ear normal.  Left Ear: External ear normal.  Mouth/Throat: No oropharyngeal exudate.  Eyes: Pupils are equal, round, and reactive to light. Conjunctivae are normal. Right eye exhibits no discharge. Left eye exhibits no discharge. No scleral icterus.  Neck: Neck supple. No JVD present. No tracheal deviation present. No thyromegaly present.  Cardiovascular: Normal rate, regular rhythm and normal heart sounds. Exam reveals no gallop and no friction rub.  No murmur heard. Pulmonary/Chest: Effort normal and breath sounds normal. No stridor. No respiratory distress. He has no wheezes. He has no rales.  Abdominal: Bowel sounds are normal. He exhibits no distension. There is no abdominal tenderness. There is no rebound.  Genitourinary: Rectum:     Guaiac result negative.     No rectal mass, anal fissure, tenderness, external hemorrhoid, internal hemorrhoid or abnormal anal tone.  Prostate is not enlarged and not tender.  Musculoskeletal:        General: No edema.  Lymphadenopathy:    He has no cervical adenopathy.  Neurological: He is alert and oriented to person, place, and time.  Skin: Skin is warm and dry. He is not diaphoretic.  Psychiatric: He has a normal mood and affect.    BP 128/80   Pulse 70   Ht 6\' 2"  (1.88 m)   Wt 224 lb 6 oz (101.8 kg)   SpO2 100%   BMI 28.81 kg/m  Wt Readings from Last 3 Encounters:  06/06/18 224 lb 6 oz (101.8 kg)  04/04/18 220 lb 6 oz (100 kg)  03/01/16 210 lb (95.3 kg)   BP Readings from Last 3 Encounters:  06/06/18 128/80  04/21/18 138/82  04/04/18 (!) 170/94   Guideline developer:  UpToDate (see UpToDate for funding source) Date Released: June 2014  Health Maintenance Due  Topic Date Due  . Hepatitis C Screening  11/16/1955  . HIV Screening  12/10/1971  . INFLUENZA VACCINE  11/03/2017  . COLONOSCOPY  05/11/2018    There are no preventive care reminders to display for this patient.  No  results found for: TSH Lab Results  Component Value Date   WBC 7.4 04/04/2018   HGB 14.6 04/04/2018   HCT 41.9 04/04/2018   MCV 92.9 04/04/2018   PLT 283 04/04/2018   Lab Results  Component Value Date   NA 139 04/04/2018   K 4.2 04/04/2018   CO2 24 04/04/2018   GLUCOSE 114 (H) 04/04/2018   BUN 13 04/04/2018   CREATININE 0.71 04/04/2018   BILITOT 1.2 04/04/2018   AST 15 04/04/2018   ALT 19 04/04/2018   PROT 7.2 04/04/2018   CALCIUM 9.6 04/04/2018  ANIONGAP 7 08/06/2014   No results found for: CHOL No results found for: HDL No results found for: LDLCALC No results found for: TRIG No results found for: CHOLHDL No results found for: HGBA1C    Assessment & Plan:   Problem List Items Addressed This Visit      Cardiovascular and Mediastinum   Essential hypertension - Primary   Relevant Medications   carvedilol (COREG CR) 20 MG 24 hr capsule   Other Relevant Orders   Comprehensive metabolic panel    Other Visit Diagnoses    Encounter for health maintenance examination with abnormal findings       Relevant Orders   CBC   Comprehensive metabolic panel   Lipid panel   PSA   Urinalysis, Routine w reflex microscopic   Adjustment insomnia       Relevant Medications   zolpidem (AMBIEN) 5 MG tablet      Meds ordered this encounter  Medications  . zolpidem (AMBIEN) 5 MG tablet    Sig: Take 1 tablet (5 mg total) by mouth at bedtime as needed for sleep.    Dispense:  15 tablet    Refill:  1  . carvedilol (COREG CR) 20 MG 24 hr capsule    Sig: Take 1 capsule (20 mg total) by mouth at bedtime.    Dispense:  90 capsule    Refill:  3    Follow-up: Return in about 6 months (around 12/07/2018).

## 2018-06-07 ENCOUNTER — Telehealth: Payer: Self-pay

## 2018-06-07 DIAGNOSIS — G4709 Other insomnia: Secondary | ICD-10-CM

## 2018-06-07 NOTE — Telephone Encounter (Signed)
Patient is requesting for the Ambien to be 10 mg tablets with a quantity of 30 tablets if you would be willing to change it for him.

## 2018-06-08 MED ORDER — ZOLPIDEM TARTRATE 10 MG PO TABS: 10.0000 mg | ORAL_TABLET | Freq: Every evening | ORAL | 0 refills | Status: DC | PRN

## 2018-06-08 NOTE — Addendum Note (Signed)
Addended by: Jon Billings on: 06/08/2018 12:17 PM   Modules accepted: Orders

## 2018-06-08 NOTE — Telephone Encounter (Signed)
I left patient a voicemail letting him know the Rx was sent in & to call back with any questions.

## 2018-06-19 ENCOUNTER — Encounter: Payer: Self-pay | Admitting: Internal Medicine

## 2018-06-26 ENCOUNTER — Other Ambulatory Visit: Payer: Self-pay | Admitting: Family Medicine

## 2018-06-26 DIAGNOSIS — I10 Essential (primary) hypertension: Secondary | ICD-10-CM

## 2018-07-13 ENCOUNTER — Telehealth: Payer: Self-pay

## 2018-07-13 NOTE — Telephone Encounter (Signed)
Patient was called to reschedule colonoscopy that is scheduled for 07/28/18 with Dr. Henrene Pastor. No answer. Left message to call and reschedule after 08/21/18.   Riki Sheer, LPN

## 2018-07-24 ENCOUNTER — Ambulatory Visit (AMBULATORY_SURGERY_CENTER): Payer: 59 | Admitting: *Deleted

## 2018-07-24 ENCOUNTER — Other Ambulatory Visit: Payer: Self-pay

## 2018-07-24 VITALS — Ht 74.0 in | Wt 208.0 lb

## 2018-07-24 DIAGNOSIS — Z860100 Personal history of colon polyps, unspecified: Secondary | ICD-10-CM

## 2018-07-24 MED ORDER — NA SULFATE-K SULFATE-MG SULF 17.5-3.13-1.6 GM/177ML PO SOLN: 1.0000 | Freq: Once | ORAL | 0 refills | Status: AC

## 2018-07-24 NOTE — Progress Notes (Signed)
No egg or soy allergy known to patient  No issues with past sedation with any surgeries  or procedures, no intubation problems  No diet pills per patient No home 02 use per patient  No blood thinners per patient  Pt denies issues with constipation  No A fib or A flutter  EMMI video sent to pt's e mail  Reviewed instructions via phone Informed pt. That instructions will be mailed.

## 2018-07-28 ENCOUNTER — Encounter: Payer: Self-pay | Admitting: Internal Medicine

## 2018-08-09 ENCOUNTER — Encounter: Payer: Self-pay | Admitting: Internal Medicine

## 2018-08-19 ENCOUNTER — Emergency Department (HOSPITAL_COMMUNITY)
Admission: EM | Admit: 2018-08-19 | Discharge: 2018-08-20 | Disposition: A | Discharge status: Home / Self Care | Payer: 59 | Attending: Emergency Medicine | Admitting: Emergency Medicine

## 2018-08-19 ENCOUNTER — Encounter (HOSPITAL_COMMUNITY): Payer: Self-pay | Admitting: Emergency Medicine

## 2018-08-19 ENCOUNTER — Emergency Department (HOSPITAL_COMMUNITY): Payer: 59

## 2018-08-19 ENCOUNTER — Other Ambulatory Visit: Payer: Self-pay

## 2018-08-19 DIAGNOSIS — S91352A Open bite, left foot, initial encounter: Secondary | ICD-10-CM | POA: Insufficient documentation

## 2018-08-19 DIAGNOSIS — Y999 Unspecified external cause status: Secondary | ICD-10-CM | POA: Insufficient documentation

## 2018-08-19 DIAGNOSIS — R600 Localized edema: Secondary | ICD-10-CM | POA: Diagnosis not present

## 2018-08-19 DIAGNOSIS — W5911XA Bitten by nonvenomous snake, initial encounter: Secondary | ICD-10-CM | POA: Diagnosis not present

## 2018-08-19 DIAGNOSIS — I10 Essential (primary) hypertension: Secondary | ICD-10-CM | POA: Insufficient documentation

## 2018-08-19 DIAGNOSIS — Y9301 Activity, walking, marching and hiking: Secondary | ICD-10-CM | POA: Diagnosis not present

## 2018-08-19 DIAGNOSIS — Y929 Unspecified place or not applicable: Secondary | ICD-10-CM | POA: Insufficient documentation

## 2018-08-19 DIAGNOSIS — S99922A Unspecified injury of left foot, initial encounter: Secondary | ICD-10-CM | POA: Diagnosis present

## 2018-08-19 MED ORDER — FENTANYL CITRATE (PF) 100 MCG/2ML IJ SOLN: 50.0000 ug | INTRAMUSCULAR | Status: DC | PRN

## 2018-08-19 NOTE — ED Notes (Signed)
Snake bite wound cleaned. Measured swelling 6 inches from bite on second toe, swelling extends to base of ankle. Will re-measure in an hour. L foot elevated.

## 2018-08-19 NOTE — ED Notes (Addendum)
PC contacted about copperhead snake bite, bite occurred at at 10:17 pm. They advised to clean to wound, elevate and measure the wound q hour for the first 3 hours and then every 2-3 hours after that. Labs at the 6 hour mark, ensure tetanus is up to date. If the swelling spreads to the calf he may need antivenom.

## 2018-08-19 NOTE — ED Notes (Signed)
Bed: KQ20 Expected date:  Expected time:  Means of arrival:  Comments: EMS- Snake Bite

## 2018-08-19 NOTE — ED Provider Notes (Signed)
Emergency Department Provider Note   I have reviewed the triage vital signs and the nursing notes.   HISTORY  Chief Complaint Snake Bite   HPI Jacob Rodriguez is a 63 y.o. male with medical problems as documented below who presents the emergency department today after a snakebite.  Approximately quarter for 10 this evening patient was walking in felt something bite his left second toe.  He got a light and realized that it was a copperhead he shows a picture which does seem to be consistent with that.  He started having swelling in his foot so called EMS who brought him here for further evaluation.  Patient states that the swelling seems to be worsening and spreading up to his ankle at this point.  He does not have any dizziness altered mental status, shortness of breath, chest pain, bruising, petechiae or other symptoms at this time just pain and swelling to his left foot.  Patient states his last tetanus was within the last couple years.   No other associated or modifying symptoms.    Past Medical History:  Diagnosis Date  . Allergy    as a child  . Hypertension     Patient Active Problem List   Diagnosis Date Noted  . Essential hypertension 04/04/2018  . Right distal insertional biceps tendinosis 03/01/2016    Past Surgical History:  Procedure Laterality Date  . COLONOSCOPY    . KNEE ARTHROSCOPY     rt x3  . KNEE ARTHROSCOPY     x2 on left  . POLYPECTOMY    . ROTATOR CUFF REPAIR      Current Outpatient Rx  . Order #: [id number] Class: Normal  . Order #: [id number] Class: Historical Med  . Order #: [id number] Class: Historical Med  . Order #: [id number] Class: Historical Med  . Order #: [id number] Class: Historical Med  . Order #: [id number] Class: Historical Med  . Order #: [id number] Class: Normal    Allergies Patient has no known allergies.  Family History  Problem Relation Age of Onset  . Colon cancer Neg Hx   . Diabetes Neg Hx   . Esophageal cancer Neg Hx   .  Rectal cancer Neg Hx   . Stomach cancer Neg Hx     Social History Social History   Tobacco Use  . Smoking status: Never Smoker  . Smokeless tobacco: Never Used  Substance Use Topics  . Alcohol use: Yes    Alcohol/week: 7.0 standard drinks    Types: 7 Standard drinks or equivalent per week    Comment: 4 times a week couple drinks  . Drug use: No    Review of Systems  All other systems negative except as documented in the HPI. All pertinent positives and negatives as reviewed in the HPI. ____________________________________________   PHYSICAL EXAM:  VITAL SIGNS: ED Triage Vitals  Enc Vitals Group     BP 08/19/18 2324 (!) 153/87     Pulse Rate 08/19/18 2324 75     Resp 08/19/18 2324 16     Temp --      Temp src --      SpO2 08/19/18 2324 97 %   Constitutional: Alert and oriented. Well appearing and in no acute distress. Eyes: Conjunctivae are normal. PERRL. EOMI. Head: Atraumatic. Nose: No congestion/rhinnorhea. Mouth/Throat: Mucous membranes are moist.  Oropharynx non-erythematous. Neck: No stridor.  No meningeal signs.   Cardiovascular: Normal rate, regular rhythm. Good peripheral circulation. Grossly normal heart sounds.   Respiratory: Normal respiratory effort.  No retractions. Lungs CTAB. Gastrointestinal: Soft and nontender. No distention.  Musculoskeletal: No lower extremity tenderness nor edema. No gross deformities of extremities. Neurologic:  Normal speech and language. No gross focal neurologic deficits are appreciated.  Skin:  Skin is warm, dry and with two small hemostatic wounds to left second toe. Mild edema and erythema to ankle. No petechiae or purpra.   ____________________________________________   LABS (all labs ordered are listed, but only abnormal results are displayed)  Labs Reviewed  BASIC METABOLIC PANEL - Abnormal; Notable for the following components:      Result Value   Chloride 114 (*)    CO2 18 (*)    Glucose, Bld 101 (*)     Calcium 8.6 (*)    All other components within normal limits  CBC WITH DIFFERENTIAL/PLATELET - Abnormal; Notable for the following components:   RBC 3.95 (*)    HCT 37.9 (*)    All other components within normal limits  CBC WITH DIFFERENTIAL/PLATELET - Abnormal; Notable for the following components:   RBC 3.71 (*)    Hemoglobin 12.0 (*)    HCT 36.1 (*)    Neutro Abs 1.4 (*)    All other components within normal limits  PROTIME-INR  PROTIME-INR  FIBRINOGEN  FIBRINOGEN  CBC WITH DIFFERENTIAL/PLATELET  PROTIME-INR  FIBRINOGEN   ____________________________________________  EKG   EKG Interpretation  Date/Time:    Ventricular Rate:    PR Interval:    QRS Duration:   QT Interval:    QTC Calculation:   R Axis:     Text Interpretation:         ____________________________________________  RADIOLOGY  Dg Foot Complete Left  Result Date: 08/20/2018 CLINICAL DATA:  Left foot swelling after being bit by a copper heads make. Evaluate for foreign body. EXAM: LEFT FOOT - COMPLETE 3+ VIEW COMPARISON:  None. FINDINGS: There is no evidence of fracture or dislocation. Mild hallux valgus and degenerative change at the first metatarsal phalangeal joint. Degenerative change of the second toe distal interphalangeal joint. Plantar calcaneal spur. Remote dorsal talus avulsion fracture. Dorsal soft tissue edema. No radiopaque foreign body or soft tissue air. IMPRESSION: Dorsal soft tissue edema. No radiopaque foreign body or acute osseous abnormality. Electronically Signed   By: Keith Rake M.D.   On: 08/20/2018 00:37    ____________________________________________   PROCEDURES  Procedure(s) performed:   Procedures   ____________________________________________   INITIAL IMPRESSION / ASSESSMENT AND PLAN / ED COURSE  Snakebite without any indication for [employer] at this time.  Will continue to monitor and check labs. tdap utd. No indication for abx. xr to eval for foreign bodies.    X-ray without any evidence of retained foreign body.  Swelling significantly improved on emergency room labs stable vital signs stable no new symptoms.  Stable for discharge with strict return precautions.  Pertinent labs & imaging results that were available during my care of the patient were reviewed by me and considered in my medical decision making (see chart for details).  A medical screening exam was performed and I feel the patient has had an appropriate workup for their chief complaint at this time and likelihood of emergent condition existing is low. They have been counseled on decision, discharge, follow up and which symptoms necessitate immediate return to the emergency department. They or their family verbally stated understanding and agreement with plan and discharged in stable condition.   ____________________________________________  FINAL CLINICAL IMPRESSION(S) / ED DIAGNOSES  Final diagnoses:  Snake bite,  initial encounter     MEDICATIONS GIVEN DURING THIS VISIT:  Medications  fentaNYL (SUBLIMAZE) injection 50 mcg (has no administration in time range)  lactated ringers bolus 1,000 mL (0 mLs Intravenous Stopped 08/20/18 0335)     NEW OUTPATIENT MEDICATIONS STARTED DURING THIS VISIT:  New Prescriptions   No medications on file    Note:  This note was prepared with assistance of Dragon voice recognition software. Occasional wrong-word or sound-a-like substitutions may have occurred due to the inherent limitations of voice recognition software.   Trisa Cranor, Corene Cornea, MD 08/20/18 [phone]

## 2018-08-19 NOTE — ED Triage Notes (Signed)
Arrived via EMS from home. About 10:17 patient was bit by a baby copperhead in his L foot near the second toe. Swelling has progressed up the foot, pain 5/10. Vitals WNL.

## 2018-08-20 LAB — CBC WITH DIFFERENTIAL/PLATELET
Abs Immature Granulocytes: 0.01 10*3/uL (ref 0.00–0.07)
Abs Immature Granulocytes: 0.02 10*3/uL (ref 0.00–0.07)
Basophils Absolute: 0 10*3/uL (ref 0.0–0.1)
Basophils Absolute: 0.1 10*3/uL (ref 0.0–0.1)
Basophils Relative: 1 %
Basophils Relative: 1 %
Eosinophils Absolute: 0.1 10*3/uL (ref 0.0–0.5)
Eosinophils Absolute: 0.2 10*3/uL (ref 0.0–0.5)
Eosinophils Relative: 3 %
Eosinophils Relative: 3 %
HCT: 36.1 % — ABNORMAL LOW (ref 39.0–52.0)
HCT: 37.9 % — ABNORMAL LOW (ref 39.0–52.0)
Hemoglobin: 12 g/dL — ABNORMAL LOW (ref 13.0–17.0)
Hemoglobin: 13.3 g/dL (ref 13.0–17.0)
Immature Granulocytes: 0 %
Immature Granulocytes: 0 %
Lymphocytes Relative: 51 %
Lymphocytes Relative: 52 %
Lymphs Abs: 2.2 10*3/uL (ref 0.7–4.0)
Lymphs Abs: 2.5 10*3/uL (ref 0.7–4.0)
MCH: 32.3 pg (ref 26.0–34.0)
MCH: 33.7 pg (ref 26.0–34.0)
MCHC: 33.2 g/dL (ref 30.0–36.0)
MCHC: 35.1 g/dL (ref 30.0–36.0)
MCV: 95.9 fL (ref 80.0–100.0)
MCV: 97.3 fL (ref 80.0–100.0)
Monocytes Absolute: 0.5 10*3/uL (ref 0.1–1.0)
Monocytes Absolute: 0.5 10*3/uL (ref 0.1–1.0)
Monocytes Relative: 10 %
Monocytes Relative: 12 %
Neutro Abs: 1.4 10*3/uL — ABNORMAL LOW (ref 1.7–7.7)
Neutro Abs: 1.8 10*3/uL (ref 1.7–7.7)
Neutrophils Relative %: 32 %
Neutrophils Relative %: 35 %
Platelets: 202 10*3/uL (ref 150–400)
Platelets: 223 10*3/uL (ref 150–400)
RBC: 3.71 MIL/uL — ABNORMAL LOW (ref 4.22–5.81)
RBC: 3.95 MIL/uL — ABNORMAL LOW (ref 4.22–5.81)
RDW: 11.9 % (ref 11.5–15.5)
RDW: 12 % (ref 11.5–15.5)
WBC: 4.3 10*3/uL (ref 4.0–10.5)
WBC: 5 10*3/uL (ref 4.0–10.5)
nRBC: 0 % (ref 0.0–0.2)
nRBC: 0 % (ref 0.0–0.2)

## 2018-08-20 LAB — BASIC METABOLIC PANEL
Anion gap: 10 (ref 5–15)
BUN: 14 mg/dL (ref 8–23)
CO2: 18 mmol/L — ABNORMAL LOW (ref 22–32)
Calcium: 8.6 mg/dL — ABNORMAL LOW (ref 8.9–10.3)
Chloride: 114 mmol/L — ABNORMAL HIGH (ref 98–111)
Creatinine, Ser: 0.68 mg/dL (ref 0.61–1.24)
GFR calc Af Amer: >60 mL/min (ref >60)
GFR calc non Af Amer: >60 mL/min (ref >60)
Glucose, Bld: 101 mg/dL — ABNORMAL HIGH (ref 70–99)
Potassium: 3.9 mmol/L (ref 3.5–5.1)
Sodium: 142 mmol/L (ref 135–145)

## 2018-08-20 LAB — PROTIME-INR
INR: 1 (ref 0.8–1.2)
INR: 1.1 (ref 0.8–1.2)
Prothrombin Time: 13.2 seconds (ref 11.4–15.2)
Prothrombin Time: 13.9 seconds (ref 11.4–15.2)

## 2018-08-20 LAB — FIBRINOGEN
Fibrinogen: 246 mg/dL (ref 210–475)
Fibrinogen: 277 mg/dL (ref 210–475)

## 2018-08-20 MED ORDER — LACTATED RINGERS IV BOLUS
1000.0000 mL | Freq: Once | INTRAVENOUS | Status: AC
  Administered 2018-08-20: 1000 mL via INTRAVENOUS

## 2018-08-20 NOTE — ED Notes (Signed)
Patient ambulated to the restroom, no weight put on his L foot. Measured before ambulation and 25 cm at foot, 26 cm at ankle, 38 1/4 cm at calf and 49 cm at thigh.

## 2018-08-20 NOTE — ED Notes (Signed)
Patient ambulated to RR without assistance. Patient used his L heel, avoiding putting pressure on the snake bite. Patient given the [place] sheet for discharge guidelines for snake bite treatments. Explained that it is normal to see a small increase in swelling for the first 24-48 hours, to clean wound x2 a day, to keep clothes off of foot and to elevate extremity. Patient verbalized understanding of all instructions.

## 2018-08-20 NOTE — ED Notes (Signed)
Measured L leg and foot again, patient noted that the swelling stopped progressing. 25 cm at foot, 26 cm at ankle, 38 1cm at calf and 49 cm at thigh.

## 2018-08-20 NOTE — ED Notes (Signed)
Measured circumference of effected extremity. 26 cm on foot, 25 cm at ankle, 38 cm at calf, 49 cm at thigh. Swelling has spread from left second toe to below his ankle, about 19 cm in length. Patient encouraged to keep leg elevated.

## 2018-08-21 ENCOUNTER — Ambulatory Visit (INDEPENDENT_AMBULATORY_CARE_PROVIDER_SITE_OTHER): Payer: 59 | Admitting: Family Medicine

## 2018-08-21 ENCOUNTER — Telehealth: Payer: Self-pay

## 2018-08-21 ENCOUNTER — Encounter: Payer: Self-pay | Admitting: Family Medicine

## 2018-08-21 ENCOUNTER — Telehealth: Payer: Self-pay | Admitting: Family Medicine

## 2018-08-21 VITALS — Ht 74.0 in

## 2018-08-21 DIAGNOSIS — I10 Essential (primary) hypertension: Secondary | ICD-10-CM | POA: Diagnosis not present

## 2018-08-21 DIAGNOSIS — W5911XD Bitten by nonvenomous snake, subsequent encounter: Secondary | ICD-10-CM | POA: Diagnosis not present

## 2018-08-21 DIAGNOSIS — L03032 Cellulitis of left toe: Secondary | ICD-10-CM | POA: Diagnosis not present

## 2018-08-21 MED ORDER — DOXYCYCLINE HYCLATE 100 MG PO TABS: 100.0000 mg | ORAL_TABLET | Freq: Two times a day (BID) | ORAL | 0 refills | Status: DC

## 2018-08-21 MED ORDER — TRAMADOL HCL 50 MG PO TABS: 50.0000 mg | ORAL_TABLET | Freq: Four times a day (QID) | ORAL | 0 refills | Status: AC | PRN

## 2018-08-21 MED ORDER — CARVEDILOL PHOSPHATE ER 20 MG PO CP24: 20.0000 mg | ORAL_CAPSULE | Freq: Every day | ORAL | 3 refills | Status: DC

## 2018-08-21 NOTE — Telephone Encounter (Signed)
Copied from [place] [id number]. Topic: General - Other >> Aug 21, 2018  9:22 AM Carolyn Stare wrote:  Pt said he was bitten by a copperhead and went to the ER and now is asking for pain medicine, he said he had some oxycodone from a previous surgery and has been taking that and is asking for that medication

## 2018-08-21 NOTE — Telephone Encounter (Signed)
Virtual is okay unless he wants to wait to be seen tomorrow when Dr. Ethelene Hal will be back in office. If virtual, will need to make sure he can show Dr. Ethelene Hal the bite.

## 2018-08-21 NOTE — Telephone Encounter (Signed)
Agreed. Need to see it.

## 2018-08-21 NOTE — Telephone Encounter (Signed)
Got him scheduled for virtual

## 2018-08-21 NOTE — Telephone Encounter (Signed)
Liane Comber is making pt an appointment with Dr. Ethelene Hal.

## 2018-08-21 NOTE — Telephone Encounter (Signed)
Pt called because he was bit by a copper head and went to the ER and they asked him if he wanted any pain medicine but he said he didn't need it at the moment but says now he is in pain and wanted to know if he could be prescribed something, I know this would require an appt but wasn't sure if this had to be in office. How did you want me to schedule this?

## 2018-08-21 NOTE — Progress Notes (Signed)
Virtual Visit via Video Note  I connected with Jacob Rodriguez on 08/21/18 at  1:00 PM EDT by a video enabled telemedicine application and verified that I am speaking with the correct   Established Patient Office Visit  Subjective:  Patient ID: Jacob Rodriguez, male    DOB: [birth year]  Age: 63 y.o. MRN: [id number]  CC:  Chief Complaint  Patient presents with  . Animal Bite    HPI Jacob Rodriguez presents for follow-up of the snakebite he received to his left second toe this past Saturday or 2 days ago.  Was seen in the emergency room.  Help for observation and released when he was determined to be stable enough to go home.  Tetanus is up-to-date.  Bite was considered to be dry.  Since that time there is been some increase in swelling and pain.  Patient denies fever chills nausea vomiting.  Past Medical History:  Diagnosis Date  . Allergy    as a child  . Hypertension     Past Surgical History:  Procedure Laterality Date  . COLONOSCOPY    . KNEE ARTHROSCOPY     rt x3  . KNEE ARTHROSCOPY     x2 on left  . POLYPECTOMY    . ROTATOR CUFF REPAIR      Family History  Problem Relation Age of Onset  . Colon cancer Neg Hx   . Diabetes Neg Hx   . Esophageal cancer Neg Hx   . Rectal cancer Neg Hx   . Stomach cancer Neg Hx     Social History   Socioeconomic History  . Marital status: Married    Spouse name: Not on file  . Number of children: Not on file  . Years of education: Not on file  . Highest education level: Not on file  Occupational History  . Not on file  Social Needs  . Financial resource strain: Not on file  . Food insecurity:    Worry: Not on file    Inability: Not on file  . Transportation needs:    Medical: Not on file    Non-medical: Not on file  Tobacco Use  . Smoking status: Never Smoker  . Smokeless tobacco: Never Used  Substance and Sexual Activity  . Alcohol use: Yes    Alcohol/week: 7.0 standard drinks    Types: 7 Standard drinks or  equivalent per week    Comment: 4 times a week couple drinks  . Drug use: No  . Sexual activity: Not on file  Lifestyle  . Physical activity:    Days per week: Not on file    Minutes per session: Not on file  . Stress: Not on file  Relationships  . Social connections:    Talks on phone: Not on file    Gets together: Not on file    Attends religious service: Not on file    Active member of club or organization: Not on file    Attends meetings of clubs or organizations: Not on file    Relationship status: Not on file  . Intimate partner violence:    Fear of current or ex partner: Not on file    Emotionally abused: Not on file    Physically abused: Not on file    Forced sexual activity: Not on file  Other Topics Concern  . Not on file  Social History Narrative  . Not on file    Outpatient Medications Prior to Visit  Medication Sig  Dispense Refill  . glucosamine-chondroitin 500-400 MG tablet Take 1 tablet by mouth daily.    . Multiple Vitamin (MULTIVITAMIN) tablet Take 1 tablet by mouth daily.    Marland Kitchen PROPECIA 1 MG tablet Take 1 mg by mouth daily as needed (hair loss).     . TURMERIC PO Take 1 tablet by mouth daily.    Marland Kitchen VITAMIN D PO Take 1 tablet by mouth daily.    . carvedilol (COREG CR) 20 MG 24 hr capsule TAKE 1 CAPSULE(20 MG) BY MOUTH AT BEDTIME (Patient taking differently: Take 20 mg by mouth at bedtime. ) 30 capsule 1  . zolpidem (AMBIEN) 10 MG tablet Take 1 tablet (10 mg total) by mouth at bedtime as needed for up to 30 days for sleep. 30 tablet 0   No facility-administered medications prior to visit.     No Known Allergies  ROS Review of Systems  Constitutional: Negative.   Respiratory: Negative.   Cardiovascular: Negative.   Musculoskeletal: Positive for gait problem and joint swelling.  Skin: Positive for color change and wound.  Neurological: Negative for weakness and numbness.  Hematological: Does not bruise/bleed easily.  Psychiatric/Behavioral: Negative.        Objective:    Physical Exam  Constitutional: He is oriented to person, place, and time. He appears well-developed and well-nourished. No distress.  HENT:  Head: Normocephalic and atraumatic.  Right Ear: External ear normal.  Left Ear: External ear normal.  Eyes: Right eye exhibits no discharge. Left eye exhibits no discharge. No scleral icterus.  Pulmonary/Chest: Effort normal.  Neurological: He is alert and oriented to person, place, and time.  Skin: Skin is warm and dry. He is not diaphoretic.     Psychiatric: He has a normal mood and affect. His behavior is normal.    Ht 6\' 2"  (1.88 m)   BMI 26.71 kg/m  Wt Readings from Last 3 Encounters:  07/24/18 208 lb (94.3 kg)  06/06/18 224 lb 6 oz (101.8 kg)  04/04/18 220 lb 6 oz (100 kg)     Health Maintenance Due  Topic Date Due  . Hepatitis C Screening  09[birth year]/1956  . HIV Screening  12/10/1971  . COLONOSCOPY  05/11/2018    There are no preventive care reminders to display for this patient.  No results found for: TSH Lab Results  Component Value Date   WBC 4.3 08/20/2018   HGB 12.0 (L) 08/20/2018   HCT 36.1 (L) 08/20/2018   MCV 97.3 08/20/2018   PLT 202 08/20/2018   Lab Results  Component Value Date   NA 142 08/20/2018   K 3.9 08/20/2018   CO2 18 (L) 08/20/2018   GLUCOSE 101 (H) 08/20/2018   BUN 14 08/20/2018   CREATININE 0.68 08/20/2018   BILITOT 1.3 (H) 06/06/2018   ALKPHOS 59 06/06/2018   AST 14 06/06/2018   ALT 22 06/06/2018   PROT 6.4 06/06/2018   ALBUMIN 4.3 06/06/2018   CALCIUM 8.6 (L) 08/20/2018   ANIONGAP 10 08/20/2018   GFR 129.26 06/06/2018   Lab Results  Component Value Date   CHOL 192 06/06/2018   Lab Results  Component Value Date   HDL 59.10 06/06/2018   Lab Results  Component Value Date   LDLCALC 112 (H) 06/06/2018   Lab Results  Component Value Date   TRIG 105.0 06/06/2018   Lab Results  Component Value Date   CHOLHDL 3 06/06/2018   No results found for: HGBA1C     Assessment & Plan:  Problem List Items Addressed This Visit      Cardiovascular and Mediastinum   Essential hypertension   Relevant Medications   carvedilol (COREG CR) 20 MG 24 hr capsule    Other Visit Diagnoses    Snake bite, subsequent encounter    -  Primary   Relevant Medications   traMADol (ULTRAM) 50 MG tablet   Cellulitis of toe of left foot       Relevant Medications   doxycycline (VIBRA-TABS) 100 MG tablet   traMADol (ULTRAM) 50 MG tablet      Meds ordered this encounter  Medications  . doxycycline (VIBRA-TABS) 100 MG tablet    Sig: Take 1 tablet (100 mg total) by mouth 2 (two) times daily.    Dispense:  20 tablet    Refill:  0  . traMADol (ULTRAM) 50 MG tablet    Sig: Take 1 tablet (50 mg total) by mouth every 6 (six) hours as needed for up to 5 days.    Dispense:  20 tablet    Refill:  0  . carvedilol (COREG CR) 20 MG 24 hr capsule    Sig: Take 1 capsule (20 mg total) by mouth at bedtime.    Dispense:  30 capsule    Refill:  3    Follow-up: Return in about 3 days (around 08/24/2018).    Libby Maw, MDperson using two identifiers.  Location: Patient: home Provider:    I discussed the limitations of evaluation and management by telemedicine and the availability of in person appointments. The patient expressed understanding and agreed to proceed.  History of Present Illness:    Observations/Objective:   Assessment and Plan:   Follow Up Instructions:    I discussed the assessment and treatment plan with the patient. The patient was provided an opportunity to ask questions and all were answered. The patient agreed with the plan and demonstrated an understanding of the instructions.   The patient was advised to call back or seek an in-person evaluation if the symptoms worsen or if the condition fails to improve as anticipated.  I provided 23 minutes of non-face-to-face time during this encounter.

## 2018-08-23 ENCOUNTER — Encounter: Payer: Self-pay | Admitting: Internal Medicine

## 2018-08-24 ENCOUNTER — Encounter: Payer: Self-pay | Admitting: Family Medicine

## 2018-08-24 ENCOUNTER — Ambulatory Visit (INDEPENDENT_AMBULATORY_CARE_PROVIDER_SITE_OTHER): Payer: 59 | Admitting: Family Medicine

## 2018-08-24 VITALS — BP 120/70 | HR 85 | Ht 74.0 in

## 2018-08-24 DIAGNOSIS — L03032 Cellulitis of left toe: Secondary | ICD-10-CM | POA: Diagnosis not present

## 2018-08-24 DIAGNOSIS — I10 Essential (primary) hypertension: Secondary | ICD-10-CM | POA: Diagnosis not present

## 2018-08-24 DIAGNOSIS — W5911XD Bitten by nonvenomous snake, subsequent encounter: Secondary | ICD-10-CM | POA: Diagnosis not present

## 2018-08-24 DIAGNOSIS — W5911XA Bitten by nonvenomous snake, initial encounter: Secondary | ICD-10-CM | POA: Insufficient documentation

## 2018-08-24 NOTE — Progress Notes (Signed)
Established Patient Office Visit  Subjective:  Patient ID: Jacob Rodriguez, male    DOB: [birth year]  Age: 63 y.o. MRN: [id number]  CC: No chief complaint on file.   HPI Jacob Rodriguez presents for follow-up of the snakebite to his left second toe and the subsequent cellulitis.  Patient is doing well.  There is decreased swelling and erythema and pain.  He has barely had to use the Ultram.  He developed some nausea after he took the Doxy on an empty stomach.  He has done better taking it with food.  Blood pressure looks good on the carvedilol and he is tolerating the drug well.  Past Medical History:  Diagnosis Date  . Allergy    as a child  . Hypertension     Past Surgical History:  Procedure Laterality Date  . COLONOSCOPY    . KNEE ARTHROSCOPY     rt x3  . KNEE ARTHROSCOPY     x2 on left  . POLYPECTOMY    . ROTATOR CUFF REPAIR      Family History  Problem Relation Age of Onset  . Colon cancer Neg Hx   . Diabetes Neg Hx   . Esophageal cancer Neg Hx   . Rectal cancer Neg Hx   . Stomach cancer Neg Hx     Social History   Socioeconomic History  . Marital status: Married    Spouse name: Not on file  . Number of children: Not on file  . Years of education: Not on file  . Highest education level: Not on file  Occupational History  . Not on file  Social Needs  . Financial resource strain: Not on file  . Food insecurity:    Worry: Not on file    Inability: Not on file  . Transportation needs:    Medical: Not on file    Non-medical: Not on file  Tobacco Use  . Smoking status: Never Smoker  . Smokeless tobacco: Never Used  Substance and Sexual Activity  . Alcohol use: Yes    Alcohol/week: 7.0 standard drinks    Types: 7 Standard drinks or equivalent per week    Comment: 4 times a week couple drinks  . Drug use: No  . Sexual activity: Not on file  Lifestyle  . Physical activity:    Days per week: Not on file    Minutes per session: Not on file  . Stress:  Not on file  Relationships  . Social connections:    Talks on phone: Not on file    Gets together: Not on file    Attends religious service: Not on file    Active member of club or organization: Not on file    Attends meetings of clubs or organizations: Not on file    Relationship status: Not on file  . Intimate partner violence:    Fear of current or ex partner: Not on file    Emotionally abused: Not on file    Physically abused: Not on file    Forced sexual activity: Not on file  Other Topics Concern  . Not on file  Social History Narrative  . Not on file    Outpatient Medications Prior to Visit  Medication Sig Dispense Refill  . carvedilol (COREG CR) 20 MG 24 hr capsule Take 1 capsule (20 mg total) by mouth at bedtime. 30 capsule 3  . doxycycline (VIBRA-TABS) 100 MG tablet Take 1 tablet (100 mg total) by mouth 2 (  two) times daily. 20 tablet 0  . glucosamine-chondroitin 500-400 MG tablet Take 1 tablet by mouth daily.    . Multiple Vitamin (MULTIVITAMIN) tablet Take 1 tablet by mouth daily.    Marland Kitchen PROPECIA 1 MG tablet Take 1 mg by mouth daily as needed (hair loss).     . traMADol (ULTRAM) 50 MG tablet Take 1 tablet (50 mg total) by mouth every 6 (six) hours as needed for up to 5 days. 20 tablet 0  . TURMERIC PO Take 1 tablet by mouth daily.    Marland Kitchen VITAMIN D PO Take 1 tablet by mouth daily.    Marland Kitchen zolpidem (AMBIEN) 10 MG tablet Take 1 tablet (10 mg total) by mouth at bedtime as needed for up to 30 days for sleep. 30 tablet 0   No facility-administered medications prior to visit.     No Known Allergies  ROS Review of Systems  Constitutional: Negative for chills, fatigue, fever and unexpected weight change.  Respiratory: Negative.   Cardiovascular: Negative.   Gastrointestinal: Negative.   Musculoskeletal: Negative for arthralgias, gait problem and myalgias.  Skin: Positive for wound. Negative for color change and rash.  Hematological: Does not bruise/bleed easily.   Psychiatric/Behavioral: Negative.       Objective:    Physical Exam  Constitutional: He is oriented to person, place, and time. He appears well-developed and well-nourished. No distress.  HENT:  Head: Normocephalic and atraumatic.  Right Ear: External ear normal.  Left Ear: External ear normal.  Eyes: Conjunctivae are normal. Right eye exhibits no discharge. Left eye exhibits no discharge. No scleral icterus.  Neck: No JVD present. No tracheal deviation present.  Pulmonary/Chest: Effort normal. No stridor.  Neurological: He is alert and oriented to person, place, and time.  Skin: Skin is warm and dry. He is not diaphoretic.     Psychiatric: He has a normal mood and affect. His behavior is normal.    There were no vitals taken for this visit. Wt Readings from Last 3 Encounters:  07/24/18 208 lb (94.3 kg)  06/06/18 224 lb 6 oz (101.8 kg)  04/04/18 220 lb 6 oz (100 kg)   BP Readings from Last 3 Encounters:  08/20/18 (!) 157/99  06/06/18 128/80  04/21/18 138/82   Guideline developer:  UpToDate (see UpToDate for funding source) Date Released: June 2014  Health Maintenance Due  Topic Date Due  . Hepatitis C Screening  06-Jul-1955  . HIV Screening  12/10/1971  . COLONOSCOPY  05/11/2018    There are no preventive care reminders to display for this patient.  No results found for: TSH Lab Results  Component Value Date   WBC 4.3 08/20/2018   HGB 12.0 (L) 08/20/2018   HCT 36.1 (L) 08/20/2018   MCV 97.3 08/20/2018   PLT 202 08/20/2018   Lab Results  Component Value Date   NA 142 08/20/2018   K 3.9 08/20/2018   CO2 18 (L) 08/20/2018   GLUCOSE 101 (H) 08/20/2018   BUN 14 08/20/2018   CREATININE 0.68 08/20/2018   BILITOT 1.3 (H) 06/06/2018   ALKPHOS 59 06/06/2018   AST 14 06/06/2018   ALT 22 06/06/2018   PROT 6.4 06/06/2018   ALBUMIN 4.3 06/06/2018   CALCIUM 8.6 (L) 08/20/2018   ANIONGAP 10 08/20/2018   GFR 129.26 06/06/2018   Lab Results  Component Value Date    CHOL 192 06/06/2018   Lab Results  Component Value Date   HDL 59.10 06/06/2018   Lab Results  Component  Value Date   LDLCALC 112 (H) 06/06/2018   Lab Results  Component Value Date   TRIG 105.0 06/06/2018   Lab Results  Component Value Date   CHOLHDL 3 06/06/2018   No results found for: HGBA1C    Assessment & Plan:   Problem List Items Addressed This Visit    None      No orders of the defined types were placed in this encounter.   Follow-up: No follow-ups on file.

## 2018-10-03 ENCOUNTER — Encounter: Payer: 59 | Admitting: Internal Medicine

## 2018-10-13 ENCOUNTER — Telehealth: Payer: Self-pay | Admitting: Internal Medicine

## 2018-10-13 NOTE — Telephone Encounter (Signed)

## 2018-10-14 ENCOUNTER — Encounter: Payer: Self-pay | Admitting: Internal Medicine

## 2018-10-14 ENCOUNTER — Other Ambulatory Visit: Payer: Self-pay

## 2018-10-14 ENCOUNTER — Ambulatory Visit (AMBULATORY_SURGERY_CENTER): Payer: 59 | Admitting: Internal Medicine

## 2018-10-14 VITALS — BP 126/76 | HR 55 | Temp 99.4°F | Resp 11 | Ht 74.0 in | Wt 208.0 lb

## 2018-10-14 DIAGNOSIS — Z86010 Personal history of colonic polyps: Secondary | ICD-10-CM

## 2018-10-14 DIAGNOSIS — D123 Benign neoplasm of transverse colon: Secondary | ICD-10-CM

## 2018-10-14 DIAGNOSIS — D120 Benign neoplasm of cecum: Secondary | ICD-10-CM

## 2018-10-14 MED ORDER — SODIUM CHLORIDE 0.9 % IV SOLN: 500.0000 mL | Freq: Once | INTRAVENOUS | Status: DC

## 2018-10-14 NOTE — Op Note (Signed)
[place] Patient Name: Jacob Rodriguez Procedure Date: 10/14/2018 8:44 AM MRN: [id number] Endoscopist: Docia Chuck. Henrene Pastor , MD Age: 63 Referring MD:  Date of Birth: [birth year] Gender: Male Account #: [id number] Procedure:                Colonoscopy with cold snare polypectomy x 2 Indications:              High risk colon cancer surveillance: Personal                            history of multiple (3 or more) adenomas. Previous                            examinations 2011 and 2015 Medicines:                Monitored Anesthesia Care Procedure:                Pre-Anesthesia Assessment:                           - Prior to the procedure, a History and Physical                            was performed, and patient medications and                            allergies were reviewed. The patient's tolerance of                            previous anesthesia was also reviewed. The risks                            and benefits of the procedure and the sedation                            options and risks were discussed with the patient.                            All questions were answered, and informed consent                            was obtained. Prior Anticoagulants: The patient has                            taken no previous anticoagulant or antiplatelet                            agents. ASA Grade Assessment: II - A patient with                            mild systemic disease. After reviewing the risks                            and benefits, the patient was deemed in  satisfactory condition to undergo the procedure.                           After obtaining informed consent, the colonoscope                            was passed under direct vision. Throughout the                            procedure, the patient's blood pressure, pulse, and                            oxygen saturations were monitored continuously. The   Colonoscope was introduced through the anus and                            advanced to the the cecum, identified by                            appendiceal orifice and ileocecal valve. The                            ileocecal valve, appendiceal orifice, and rectum                            were photographed. The quality of the bowel                            preparation was excellent. The colonoscopy was                            performed without difficulty. The patient tolerated                            the procedure well. The bowel preparation used was                            SUPREP via split dose instruction. Scope In: 8:48:45 AM Scope Out: 9:04:06 AM Scope Withdrawal Time: 0 hours 13 minutes 30 seconds  Total Procedure Duration: 0 hours 15 minutes 21 seconds  Findings:                 Two polyps were found in the transverse colon and                            cecum. The polyps were 1 to 5 mm in size. These                            polyps were removed with a cold snare. Resection                            and retrieval were complete.                           The  exam was otherwise without abnormality on                            direct and retroflexion views. Internal hemorrhoids                            present. Complications:            No immediate complications. Estimated blood loss:                            None. Estimated Blood Loss:     Estimated blood loss: none. Impression:               - Two 1 to 5 mm polyps in the transverse colon and                            in the cecum, removed with a cold snare. Resected                            and retrieved.                           - The examination was otherwise normal on direct                            and retroflexion views. Recommendation:           - Repeat colonoscopy in 5 years for surveillance                            (history of multiple adenomas).                           - Patient has a  contact number available for                            emergencies. The signs and symptoms of potential                            delayed complications were discussed with the                            patient. Return to normal activities tomorrow.                            Written discharge instructions were provided to the                            patient.                           - Resume previous diet.                           - Continue present medications.                           -  Await pathology results. Docia Chuck. Henrene Pastor, MD 10/14/2018 9:13:02 AM This report has been signed electronically.

## 2018-10-14 NOTE — Patient Instructions (Signed)
YOU HAD AN ENDOSCOPIC PROCEDURE TODAY AT THE [facility]:   Refer to the procedure report that was given to you for any specific questions about what was found during the examination.  If the procedure report does not answer your questions, please call your gastroenterologist to clarify.  If you requested that your care partner not be given the details of your procedure findings, then the procedure report has been included in a sealed envelope for you to review at your convenience later.  YOU SHOULD EXPECT: Some feelings of bloating in the abdomen. Passage of more gas than usual.  Walking can help get rid of the air that was put into your GI tract during the procedure and reduce the bloating. If you had a lower endoscopy (such as a colonoscopy or flexible sigmoidoscopy) you may notice spotting of blood in your stool or on the toilet paper. If you underwent a bowel prep for your procedure, you may not have a normal bowel movement for a few days.  Please Note:  You might notice some irritation and congestion in your nose or some drainage.  This is from the oxygen used during your procedure.  There is no need for concern and it should clear up in a day or so.  SYMPTOMS TO REPORT IMMEDIATELY:   Following lower endoscopy (colonoscopy or flexible sigmoidoscopy):  Excessive amounts of blood in the stool  Significant tenderness or worsening of abdominal pains  Swelling of the abdomen that is new, acute  Fever of 100F or higher    For urgent or emergent issues, a gastroenterologist can be reached at any hour by calling [phone].   DIET:  We do recommend a small meal at first, but then you may proceed to your regular diet.  Drink plenty of fluids but you should avoid alcoholic beverages for 24 hours.  ACTIVITY:  You should plan to take it easy for the rest of today and you should NOT DRIVE or use heavy machinery until tomorrow (because of the sedation medicines used during the test).     FOLLOW UP: Our staff will call the number listed on your records 48-72 hours following your procedure to check on you and address any questions or concerns that you may have regarding the information given to you following your procedure. If we do not reach you, we will leave a message.  We will attempt to reach you two times.  During this call, we will ask if you have developed any symptoms of COVID 19. If you develop any symptoms (ie: fever, flu-like symptoms, shortness of breath, cough etc.) before then, please call [phone].  If you test positive for Covid 19 in the 2 weeks post procedure, please call and report this information to us.    If any biopsies were taken you will be contacted by phone or by letter within the next 1-3 weeks.  Please call us at [phone] if you have not heard about the biopsies in 3 weeks.    SIGNATURES/CONFIDENTIALITY: You and/or your care partner have signed paperwork which will be entered into your electronic medical record.  These signatures attest to the fact that that the information above on your After Visit Summary has been reviewed and is understood.  Full responsibility of the confidentiality of this discharge information lies with you and/or your care-partner.    Handout was given to you on polyps.  You may resume your current medications today. Await biopsy results. Please call if any questions   or concerns.   

## 2018-10-14 NOTE — Progress Notes (Signed)
No problems noted in the recovery room. maw 

## 2018-10-14 NOTE — Progress Notes (Signed)
Called to room to assist during endoscopic procedure.  Patient ID and intended procedure confirmed with present staff. Received instructions for my participation in the procedure from the performing physician.  

## 2018-10-14 NOTE — Progress Notes (Signed)
A/ox3, pleased with MAC, report to RN 

## 2018-10-17 ENCOUNTER — Telehealth: Payer: Self-pay

## 2018-10-17 NOTE — Telephone Encounter (Signed)
Left message on follow up call. 

## 2018-10-18 ENCOUNTER — Encounter: Payer: Self-pay | Admitting: Internal Medicine

## 2018-12-07 ENCOUNTER — Other Ambulatory Visit: Payer: Self-pay

## 2018-12-07 DIAGNOSIS — I10 Essential (primary) hypertension: Secondary | ICD-10-CM

## 2018-12-07 MED ORDER — CARVEDILOL PHOSPHATE ER 20 MG PO CP24: 20.0000 mg | ORAL_CAPSULE | Freq: Every day | ORAL | 3 refills | Status: DC

## 2018-12-08 ENCOUNTER — Ambulatory Visit: Payer: Self-pay | Admitting: Family Medicine

## 2018-12-13 ENCOUNTER — Ambulatory Visit: Payer: 59 | Admitting: Family Medicine

## 2019-03-12 ENCOUNTER — Encounter: Payer: Self-pay | Admitting: Family Medicine

## 2019-03-12 ENCOUNTER — Ambulatory Visit (INDEPENDENT_AMBULATORY_CARE_PROVIDER_SITE_OTHER): Payer: 59 | Admitting: Family Medicine

## 2019-03-12 ENCOUNTER — Other Ambulatory Visit: Payer: Self-pay

## 2019-03-12 VITALS — BP 122/78 | HR 73 | Temp 97.5°F | Ht 74.0 in | Wt 228.2 lb

## 2019-03-12 DIAGNOSIS — Z23 Encounter for immunization: Secondary | ICD-10-CM

## 2019-03-12 DIAGNOSIS — L659 Nonscarring hair loss, unspecified: Secondary | ICD-10-CM | POA: Insufficient documentation

## 2019-03-12 DIAGNOSIS — B001 Herpesviral vesicular dermatitis: Secondary | ICD-10-CM

## 2019-03-12 DIAGNOSIS — N452 Orchitis: Secondary | ICD-10-CM | POA: Diagnosis not present

## 2019-03-12 DIAGNOSIS — F5102 Adjustment insomnia: Secondary | ICD-10-CM

## 2019-03-12 MED ORDER — PROPECIA 1 MG PO TABS: 1.0000 mg | ORAL_TABLET | Freq: Every day | ORAL | 3 refills | Status: DC

## 2019-03-12 MED ORDER — VALTREX 1 G PO TABS: ORAL_TABLET | ORAL | 1 refills | Status: DC

## 2019-03-12 MED ORDER — ZOLPIDEM TARTRATE 10 MG PO TABS: 10.0000 mg | ORAL_TABLET | Freq: Every evening | ORAL | 0 refills | Status: DC | PRN

## 2019-03-12 MED ORDER — DOXYCYCLINE HYCLATE 100 MG PO TABS: 100.0000 mg | ORAL_TABLET | Freq: Two times a day (BID) | ORAL | 0 refills | Status: DC

## 2019-03-12 NOTE — Patient Instructions (Signed)
Orchitis  Orchitis is inflammation of a testicle. Testicles are the male organs that produce sperm. The testicles are held in a fleshy sac (scrotum) located behind the penis. Orchitis usually affects only one testicle, but it can affect both. Orchitis is caused by infection. Many kinds of bacteria and viruses can cause this infection. The condition can develop suddenly. What are the causes? This condition may be caused by:  Infection from viruses or bacteria.  Other organisms, such as fungi or parasites (rare). This is common in men who have a weak body defense system (immune system), such as men with HIV. Bacteria   Bacterial orchitis often occurs along with an infection of the tube that collects and stores sperm (epididymis).  In men who are not sexually active, this infection usually starts as a urinary tract infection and spreads to the testicle.  In sexually active men, sexually transmitted infections (STIs) are the most common cause of bacterial orchitis. These can include: ? Gonorrhea. ? Chlamydia. Viruses  Mumps is the most common cause of viral orchitis, though mumps is now rare in many areas because of vaccination.  Other viruses that can cause orchitis include: ? The chickenpox virus (varicella-zoster virus). ? The virus that causes mononucleosis (Epstein-Barr virus). What increases the risk? The following factors may make you more likely to develop this condition:  For viral orchitis: ? Not having been vaccinated against mumps.  For bacterial orchitis: ? Having had frequent urinary tract infections. ? Engaging in high-risk sexual behaviors, such as having multiple sexual partners or having sex without using a condom. ? Having a sexual partner with an STI. ? Having had urinary tract surgery. ? Using a tube that is passed through the penis to drain urine (Foley catheter). ? Having an enlarged prostate gland. What are the signs or symptoms? The most common symptoms of  orchitis are swelling and pain in the scrotum. Other signs and symptoms may include:  Feeling generally sick (malaise).  Fever and chills.  Painful urination.  Painful ejaculation.  Headache.  Fatigue.  Nausea.  Blood or discharge from the penis.  Swollen lymph nodes in the groin area (inguinal nodes). How is this diagnosed? This condition may be diagnosed based on:  Your symptoms. Your health care provider may suspect orchitis if you have a painful, swollen testicle along with other signs and symptoms of the condition.  A physical exam. You may also have other tests, including:  A blood test to check for signs of infection.  A urine test to check for a urinary tract infection or STI.  Using a swab to collect a fluid sample from the tip of the penis to test for STIs.  Taking an image of the testicle using sound waves and a computer (testicular ultrasound). How is this treated? Treatment for this condition depends on the cause.  For bacterial orchitis, your health care provider may prescribe antibiotic medicines. Bacterial infections usually clear up within a few days. For both viral infections and bacterial infections, you may be treated with:  Rest.  Anti-inflammatory medicines.  Pain medicines.  Raising (elevating) the scrotum with a towel or pillow underneath and applying ice. Follow these instructions at home:  Rest as directed by your health care provider.  Take over-the-counter and prescription medicines only as told by your health care provider.  If you were prescribed an antibiotic medicine, take it as told by your health care provider. Do not stop taking the antibiotic even if you start to feel better.  Do not have sex until your health care provider says it is okay to do so.  Elevate your scrotum and apply ice as directed: ? Put ice in a plastic bag. ? Place a small towel or pillow between your legs. ? Rest your scrotum on the pillow or towel. ?  Place another towel between your skin and the plastic bag. ? Leave the ice on for 20 minutes, 2-3 times a day.  Keep all follow-up visits as told by your health care provider. This is important. Contact a health care provider if:  You have a fever.  Pain and swelling have not gotten better after 3 days. Get help right away if:  Your pain is getting worse.  The swelling in your testicle gets worse. Summary  Orchitis is inflammation of a testicle. It is caused by an infection from bacteria or a virus.  The most common symptoms of orchitis are swelling and pain in the scrotum.  Treatment for this condition depends on the cause. It may include medicines to fight the infection, reduce inflammation, and relieve the pain.  Follow your health care provider's instructions about resting, icing, not having sex, and taking medicines. This information is not intended to replace advice given to you by your health care provider. Make sure you discuss any questions you have with your health care provider. Document Released: 03/19/2000 Document Revised: 04/08/2017 Document Reviewed: 04/08/2017 Elsevier Patient Education  2020 [employer].

## 2019-03-12 NOTE — Progress Notes (Signed)
Established Patient Office Visit  Subjective:  Patient ID: Jacob Rodriguez, male    DOB: Jan 07, 1956  Age: 63 y.o. MRN: [id number]  CC:  Chief Complaint  Patient presents with  . Medication Refill    Valtrex(brand name only) and Propecia(brand name only),ambien  . Testicle Pain    swelling    HPI Jacob Rodriguez presents for evaluation and treatment of swelling in his right testicle over the last few months.  Denies fever chills discharge or dysuria.  There is been no tenderness in the to bleeding from the testicle.  History of a hydrocele in the past.  He requests a refill on his Propecia.  He takes an fraction of health daily and it seems to help with hair loss.  Requests refill on his Ambien.  Continues to use the drug very sparingly.  Uses Valtrex as needed oral herpes.  He requests to trade because it seems to work better for him.  Continues to travel some with his job on [place].  Past Medical History:  Diagnosis Date  . Allergy    as a child  . Hypertension     Past Surgical History:  Procedure Laterality Date  . COLONOSCOPY    . HYDROCELE EXCISION  01/09/1993  . KNEE ARTHROSCOPY     rt x3  . KNEE ARTHROSCOPY     x2 on left  . POLYPECTOMY    . ROTATOR CUFF REPAIR      Family History  Problem Relation Age of Onset  . Colon cancer Neg Hx   . Diabetes Neg Hx   . Esophageal cancer Neg Hx   . Rectal cancer Neg Hx   . Stomach cancer Neg Hx     Social History   Socioeconomic History  . Marital status: Married    Spouse name: Not on file  . Number of children: Not on file  . Years of education: Not on file  . Highest education level: Not on file  Occupational History  . Not on file  Social Needs  . Financial resource strain: Not on file  . Food insecurity    Worry: Not on file    Inability: Not on file  . Transportation needs    Medical: Not on file    Non-medical: Not on file  Tobacco Use  . Smoking status: Never Smoker  . Smokeless tobacco: Never  Used  Substance and Sexual Activity  . Alcohol use: Yes    Alcohol/week: 7.0 standard drinks    Types: 7 Standard drinks or equivalent per week    Comment: 4 times a week couple drinks  . Drug use: No  . Sexual activity: Not on file  Lifestyle  . Physical activity    Days per week: Not on file    Minutes per session: Not on file  . Stress: Not on file  Relationships  . Social Herbalist on phone: Not on file    Gets together: Not on file    Attends religious service: Not on file    Active member of club or organization: Not on file    Attends meetings of clubs or organizations: Not on file    Relationship status: Not on file  . Intimate partner violence    Fear of current or ex partner: Not on file    Emotionally abused: Not on file    Physically abused: Not on file    Forced sexual activity: Not on file  Other Topics  Concern  . Not on file  Social History Narrative  . Not on file    Outpatient Medications Prior to Visit  Medication Sig Dispense Refill  . carvedilol (COREG CR) 20 MG 24 hr capsule Take 1 capsule (20 mg total) by mouth at bedtime. 30 capsule 3  . glucosamine-chondroitin 500-400 MG tablet Take 1 tablet by mouth daily.    . Multiple Vitamin (MULTIVITAMIN) tablet Take 1 tablet by mouth daily.    . TURMERIC PO Take 1 tablet by mouth daily.    Marland Kitchen VITAMIN D PO Take 1 tablet by mouth daily.    Marland Kitchen PROPECIA 1 MG tablet Take 1 mg by mouth daily as needed (hair loss).     Marland Kitchen doxycycline (VIBRA-TABS) 100 MG tablet Take 1 tablet (100 mg total) by mouth 2 (two) times daily. (Patient not taking: Reported on 10/14/2018) 20 tablet 0  . valACYclovir (VALTREX) 1000 MG tablet Take 1,000 mg by mouth as needed.    . zolpidem (AMBIEN) 10 MG tablet Take 1 tablet (10 mg total) by mouth at bedtime as needed for up to 30 days for sleep. 30 tablet 0   No facility-administered medications prior to visit.     No Known Allergies  ROS Review of Systems  Constitutional:  Negative for chills, diaphoresis, fatigue, fever and unexpected weight change.  HENT: Negative.   Eyes: Negative for photophobia and visual disturbance.  Respiratory: Negative.   Cardiovascular: Negative.   Gastrointestinal: Negative.   Endocrine: Negative for polyphagia and polyuria.  Genitourinary: Negative for discharge, dysuria, frequency, penile pain and urgency.  Musculoskeletal: Negative.   Skin: Negative for pallor and rash.  Allergic/Immunologic: Negative for immunocompromised state.  Neurological: Negative for light-headedness and headaches.  Hematological: Does not bruise/bleed easily.  Psychiatric/Behavioral: Positive for sleep disturbance.      Objective:    Physical Exam  Constitutional: He is oriented to person, place, and time. He appears well-developed and well-nourished. No distress.  HENT:  Head: Normocephalic and atraumatic.  Right Ear: External ear normal.  Left Ear: External ear normal.  Eyes: Conjunctivae are normal. Right eye exhibits no discharge. Left eye exhibits no discharge. No scleral icterus.  Neck: No JVD present. No tracheal deviation present.  Cardiovascular: Normal rate, regular rhythm and normal heart sounds.  Pulmonary/Chest: Effort normal and breath sounds normal. No stridor.  Abdominal: Hernia confirmed negative in the right inguinal area and confirmed negative in the left inguinal area.  Genitourinary: Right testis shows swelling. Right testis shows no mass and no tenderness (mild). Left testis shows no mass, no swelling and no tenderness. Circumcised. No hypospadias, penile erythema or penile tenderness. No discharge found.  Lymphadenopathy:       Right: No inguinal adenopathy present.       Left: No inguinal adenopathy present.  Neurological: He is alert and oriented to person, place, and time.  Skin: Skin is warm and dry. He is not diaphoretic.  Psychiatric: He has a normal mood and affect. His behavior is normal.    BP 122/78   Pulse  73   Temp (!) 97.5 F (36.4 C)   Ht 6\' 2"  (1.88 m)   Wt 228 lb 3.2 oz (103.5 kg)   SpO2 99%   BMI 29.30 kg/m  Wt Readings from Last 3 Encounters:  03/12/19 228 lb 3.2 oz (103.5 kg)  10/14/18 208 lb (94.3 kg)  07/24/18 208 lb (94.3 kg)     Health Maintenance Due  Topic Date Due  . Hepatitis C  Screening  1955-04-07  . HIV Screening  12/10/1971  . INFLUENZA VACCINE  11/04/2018    There are no preventive care reminders to display for this patient.  No results found for: TSH Lab Results  Component Value Date   WBC 4.3 08/20/2018   HGB 12.0 (L) 08/20/2018   HCT 36.1 (L) 08/20/2018   MCV 97.3 08/20/2018   PLT 202 08/20/2018   Lab Results  Component Value Date   NA 142 08/20/2018   K 3.9 08/20/2018   CO2 18 (L) 08/20/2018   GLUCOSE 101 (H) 08/20/2018   BUN 14 08/20/2018   CREATININE 0.68 08/20/2018   BILITOT 1.3 (H) 06/06/2018   ALKPHOS 59 06/06/2018   AST 14 06/06/2018   ALT 22 06/06/2018   PROT 6.4 06/06/2018   ALBUMIN 4.3 06/06/2018   CALCIUM 8.6 (L) 08/20/2018   ANIONGAP 10 08/20/2018   GFR 129.26 06/06/2018   Lab Results  Component Value Date   CHOL 192 06/06/2018   Lab Results  Component Value Date   HDL 59.10 06/06/2018   Lab Results  Component Value Date   LDLCALC 112 (H) 06/06/2018   Lab Results  Component Value Date   TRIG 105.0 06/06/2018   Lab Results  Component Value Date   CHOLHDL 3 06/06/2018   No results found for: HGBA1C    Assessment & Plan:   Problem List Items Addressed This Visit      Digestive   Herpes labialis   Relevant Medications   VALTREX 1 g tablet     Genitourinary   Orchitis   Relevant Medications   doxycycline (VIBRA-TABS) 100 MG tablet   Other Relevant Orders   US Scrotum     Other   Adjustment insomnia - Primary   Relevant Medications   zolpidem (AMBIEN) 10 MG tablet   Hair loss   Relevant Medications   PROPECIA 1 MG tablet   Flu vaccine need   Relevant Orders   Flu Vaccine QUAD 6+ mos PF IM  (Fluarix Quad PF)      Meds ordered this encounter  Medications  . doxycycline (VIBRA-TABS) 100 MG tablet    Sig: Take 1 tablet (100 mg total) by mouth 2 (two) times daily.    Dispense:  20 tablet    Refill:  0  . zolpidem (AMBIEN) 10 MG tablet    Sig: Take 1 tablet (10 mg total) by mouth at bedtime as needed for sleep.    Dispense:  30 tablet    Refill:  0  . VALTREX 1 g tablet    Sig: Take 500mg  twice daily for 3 days as needed for outbreaks.    Dispense:  9 tablet    Refill:  1  . PROPECIA 1 MG tablet    Sig: Take 1 tablet (1 mg total) by mouth daily.    Dispense:  30 tablet    Refill:  3    Follow-up: Return in about 2 weeks (around 03/26/2019), or follow up in March for physical.    Libby Maw, MD

## 2019-03-21 ENCOUNTER — Ambulatory Visit (HOSPITAL_BASED_OUTPATIENT_CLINIC_OR_DEPARTMENT_OTHER)
Admission: RE | Admit: 2019-03-21 | Discharge: 2019-03-21 | Disposition: A | Discharge status: Home / Self Care | Payer: 59 | Source: Ambulatory Visit | Attending: Family Medicine | Admitting: Family Medicine

## 2019-03-21 ENCOUNTER — Other Ambulatory Visit: Payer: Self-pay

## 2019-03-21 DIAGNOSIS — N452 Orchitis: Secondary | ICD-10-CM | POA: Diagnosis not present

## 2019-04-02 ENCOUNTER — Telehealth: Payer: Self-pay | Admitting: Family Medicine

## 2019-04-02 DIAGNOSIS — B001 Herpesviral vesicular dermatitis: Secondary | ICD-10-CM

## 2019-04-02 MED ORDER — VALACYCLOVIR HCL 1 G PO TABS: 500.0000 mg | ORAL_TABLET | Freq: Two times a day (BID) | ORAL | 3 refills | Status: AC

## 2019-04-02 NOTE — Telephone Encounter (Signed)
Dr. Ethelene Hal, I received a fax from pt's pharmacy and they state pt's insurance prefers the generic of his valtrex.

## 2019-04-02 NOTE — Telephone Encounter (Signed)
Okay. Switched him over to the generic.

## 2019-05-23 ENCOUNTER — Encounter: Payer: Self-pay | Admitting: Family Medicine

## 2019-05-23 ENCOUNTER — Telehealth: Payer: 59 | Admitting: Family Medicine

## 2019-05-23 ENCOUNTER — Telehealth (INDEPENDENT_AMBULATORY_CARE_PROVIDER_SITE_OTHER): Payer: 59 | Admitting: Family Medicine

## 2019-05-23 VITALS — Temp 97.9°F | Ht 74.0 in | Wt 210.0 lb

## 2019-05-23 DIAGNOSIS — H1031 Unspecified acute conjunctivitis, right eye: Secondary | ICD-10-CM | POA: Diagnosis not present

## 2019-05-23 MED ORDER — TOBRAMYCIN 0.3 % OP SOLN: 2.0000 [drp] | Freq: Four times a day (QID) | OPHTHALMIC | 0 refills | Status: DC

## 2019-05-23 NOTE — Progress Notes (Signed)
Established Patient Office Visit  Subjective:  Patient ID: Jacob Rodriguez, male    DOB: [birth year]  Age: 64 y.o. MRN: [id number]  CC:  Chief Complaint  Patient presents with  . Eye Drainage    c/o red itchy eyes, right eye draining x 1 week.     HPI Jacob Rodriguez presents for 6 to 7-day history of irritation of OD.  Vision is not compromised.  Conjunctive is red and inflamed.  There is a watery discharge with no a.m. matting.  Denies injury.  Was around cats prior to onset.  He is allergic to them.  OS is not affected.  Denies fever or URI symptoms.  Past Medical History:  Diagnosis Date  . Allergy    as a child  . Hypertension     Past Surgical History:  Procedure Laterality Date  . COLONOSCOPY    . HYDROCELE EXCISION  01/09/1993  . KNEE ARTHROSCOPY     rt x3  . KNEE ARTHROSCOPY     x2 on left  . POLYPECTOMY    . ROTATOR CUFF REPAIR      Family History  Problem Relation Age of Onset  . Colon cancer Neg Hx   . Diabetes Neg Hx   . Esophageal cancer Neg Hx   . Rectal cancer Neg Hx   . Stomach cancer Neg Hx     Social History   Socioeconomic History  . Marital status: Married    Spouse name: Not on file  . Number of children: Not on file  . Years of education: Not on file  . Highest education level: Not on file  Occupational History  . Not on file  Tobacco Use  . Smoking status: Never Smoker  . Smokeless tobacco: Never Used  Substance and Sexual Activity  . Alcohol use: Yes    Alcohol/week: 7.0 standard drinks    Types: 7 Standard drinks or equivalent per week    Comment: 4 times a week couple drinks  . Drug use: No  . Sexual activity: Not on file  Other Topics Concern  . Not on file  Social History Narrative  . Not on file   Social Determinants of Health   Financial Resource Strain:   . Difficulty of Paying Living Expenses: Not on file  Food Insecurity:   . Worried About Charity fundraiser in the Last Year: Not on file  . Ran Out of Food  in the Last Year: Not on file  Transportation Needs:   . Lack of Transportation (Medical): Not on file  . Lack of Transportation (Non-Medical): Not on file  Physical Activity:   . Days of Exercise per Week: Not on file  . Minutes of Exercise per Session: Not on file  Stress:   . Feeling of Stress : Not on file  Social Connections:   . Frequency of Communication with Friends and Family: Not on file  . Frequency of Social Gatherings with Friends and Family: Not on file  . Attends Religious Services: Not on file  . Active Member of Clubs or Organizations: Not on file  . Attends Archivist Meetings: Not on file  . Marital Status: Not on file  Intimate Partner Violence:   . Fear of Current or Ex-Partner: Not on file  . Emotionally Abused: Not on file  . Physically Abused: Not on file  . Sexually Abused: Not on file    Outpatient Medications Prior to Visit  Medication Sig Dispense Refill  .  carvedilol (COREG CR) 20 MG 24 hr capsule Take 1 capsule (20 mg total) by mouth at bedtime. 30 capsule 3  . glucosamine-chondroitin 500-400 MG tablet Take 1 tablet by mouth daily.    . Multiple Vitamin (MULTIVITAMIN) tablet Take 1 tablet by mouth daily.    Marland Kitchen PROPECIA 1 MG tablet Take 1 tablet (1 mg total) by mouth daily. 30 tablet 3  . TURMERIC PO Take 1 tablet by mouth daily.    Marland Kitchen VITAMIN D PO Take 1 tablet by mouth daily.    Marland Kitchen doxycycline (VIBRA-TABS) 100 MG tablet Take 1 tablet (100 mg total) by mouth 2 (two) times daily. (Patient not taking: Reported on 05/23/2019) 20 tablet 0  . zolpidem (AMBIEN) 10 MG tablet Take 1 tablet (10 mg total) by mouth at bedtime as needed for sleep. 30 tablet 0   No facility-administered medications prior to visit.    No Known Allergies  ROS Review of Systems  Constitutional: Negative.   HENT: Positive for postnasal drip and rhinorrhea.   Eyes: Positive for discharge, redness and itching. Negative for photophobia and visual disturbance.  Respiratory:  Negative.   Cardiovascular: Negative.   Psychiatric/Behavioral: Negative.       Objective:    Physical Exam  Constitutional: He is oriented to person, place, and time. He appears well-developed and well-nourished. No distress.  HENT:  Head: Normocephalic and atraumatic.  Right Ear: External ear normal.  Left Ear: External ear normal.  Eyes: Pupils are equal, round, and reactive to light. EOM are normal. Right eye exhibits discharge. Right eye exhibits no exudate. Right conjunctiva is injected.  Neck: No JVD present. No tracheal deviation present.  Pulmonary/Chest: Effort normal. No stridor.  Neurological: He is alert and oriented to person, place, and time.  Skin: He is not diaphoretic.  Psychiatric: He has a normal mood and affect. His behavior is normal.    Temp 97.9 F (36.6 C) (Oral)   Ht 6\' 2"  (1.88 m)   Wt 210 lb (95.3 kg) Comment: per pt  BMI 26.96 kg/m  Wt Readings from Last 3 Encounters:  05/23/19 210 lb (95.3 kg)  03/12/19 228 lb 3.2 oz (103.5 kg)  10/14/18 208 lb (94.3 kg)     Health Maintenance Due  Topic Date Due  . Hepatitis C Screening  10-05-1955  . HIV Screening  12/10/1971    There are no preventive care reminders to display for this patient.  No results found for: TSH Lab Results  Component Value Date   WBC 4.3 08/20/2018   HGB 12.0 (L) 08/20/2018   HCT 36.1 (L) 08/20/2018   MCV 97.3 08/20/2018   PLT 202 08/20/2018   Lab Results  Component Value Date   NA 142 08/20/2018   K 3.9 08/20/2018   CO2 18 (L) 08/20/2018   GLUCOSE 101 (H) 08/20/2018   BUN 14 08/20/2018   CREATININE 0.68 08/20/2018   BILITOT 1.3 (H) 06/06/2018   ALKPHOS 59 06/06/2018   AST 14 06/06/2018   ALT 22 06/06/2018   PROT 6.4 06/06/2018   ALBUMIN 4.3 06/06/2018   CALCIUM 8.6 (L) 08/20/2018   ANIONGAP 10 08/20/2018   GFR 129.26 06/06/2018   Lab Results  Component Value Date   CHOL 192 06/06/2018   Lab Results  Component Value Date   HDL 59.10 06/06/2018    Lab Results  Component Value Date   LDLCALC 112 (H) 06/06/2018   Lab Results  Component Value Date   TRIG 105.0 06/06/2018   Lab  Results  Component Value Date   CHOLHDL 3 06/06/2018   No results found for: HGBA1C    Assessment & Plan:   Problem List Items Addressed This Visit    None    Visit Diagnoses    Acute conjunctivitis of right eye, unspecified acute conjunctivitis type    -  Primary   Relevant Medications   tobramycin (TOBREX) 0.3 % ophthalmic solution      Meds ordered this encounter  Medications  . tobramycin (TOBREX) 0.3 % ophthalmic solution    Sig: Place 2 drops into the right eye every 6 (six) hours.    Dispense:  5 mL    Refill:  0    Follow-up: Return in about 4 days (around 05/27/2019), or if symptoms worsen or fail to improve.    Libby Maw, MD   Virtual Visit via Video Note  I connected with Jacob Rodriguez on 05/23/19 at  8:30 AM EST by a video enabled telemedicine application and verified that I am speaking with the correct person using two identifiers.  Location: Patient: home with wife.  Provider:    I discussed the limitations of evaluation and management by telemedicine and the availability of in person appointments. The patient expressed understanding and agreed to proceed.  History of Present Illness:    Observations/Objective:   Assessment and Plan:   Follow Up Instructions:    I discussed the assessment and treatment plan with the patient. The patient was provided an opportunity to ask questions and all were answered. The patient agreed with the plan and demonstrated an understanding of the instructions.   The patient was advised to call back or seek an in-person evaluation if the symptoms worsen or if the condition fails to improve as anticipated.  I provided 20 minutes of non-face-to-face time during this encounter.   Libby Maw, MD

## 2019-05-30 ENCOUNTER — Ambulatory Visit: Payer: 59 | Attending: Internal Medicine

## 2019-05-30 DIAGNOSIS — Z20822 Contact with and (suspected) exposure to covid-19: Secondary | ICD-10-CM

## 2019-05-31 LAB — NOVEL CORONAVIRUS, NAA: SARS-CoV-2, NAA: NOT DETECTED

## 2019-06-07 ENCOUNTER — Other Ambulatory Visit: Payer: Self-pay

## 2019-06-08 ENCOUNTER — Ambulatory Visit (INDEPENDENT_AMBULATORY_CARE_PROVIDER_SITE_OTHER): Payer: 59 | Admitting: Family Medicine

## 2019-06-08 ENCOUNTER — Encounter: Payer: Self-pay | Admitting: Family Medicine

## 2019-06-08 VITALS — BP 126/78 | HR 58 | Temp 96.9°F | Ht 73.0 in | Wt 226.7 lb

## 2019-06-08 DIAGNOSIS — Z1159 Encounter for screening for other viral diseases: Secondary | ICD-10-CM

## 2019-06-08 DIAGNOSIS — D649 Anemia, unspecified: Secondary | ICD-10-CM

## 2019-06-08 DIAGNOSIS — E559 Vitamin D deficiency, unspecified: Secondary | ICD-10-CM | POA: Diagnosis not present

## 2019-06-08 DIAGNOSIS — Z0000 Encounter for general adult medical examination without abnormal findings: Secondary | ICD-10-CM | POA: Diagnosis not present

## 2019-06-08 DIAGNOSIS — Z114 Encounter for screening for human immunodeficiency virus [HIV]: Secondary | ICD-10-CM

## 2019-06-08 LAB — COMPREHENSIVE METABOLIC PANEL
ALT: 19 U/L (ref 0–53)
AST: 16 U/L (ref 0–37)
Albumin: 4.3 g/dL (ref 3.5–5.2)
Alkaline Phosphatase: 53 U/L (ref 39–117)
BUN: 12 mg/dL (ref 6–23)
CO2: 26 mEq/L (ref 19–32)
Calcium: 9.2 mg/dL (ref 8.4–10.5)
Chloride: 104 mEq/L (ref 96–112)
Creatinine, Ser: 0.71 mg/dL (ref 0.40–1.50)
GFR: 112.24 mL/min (ref >60.00)
Glucose, Bld: 104 mg/dL — ABNORMAL HIGH (ref 70–99)
Potassium: 4.3 mEq/L (ref 3.5–5.1)
Sodium: 136 mEq/L (ref 135–145)
Total Bilirubin: 1.1 mg/dL (ref 0.2–1.2)
Total Protein: 6.7 g/dL (ref 6.0–8.3)

## 2019-06-08 LAB — URINALYSIS, ROUTINE W REFLEX MICROSCOPIC
Bilirubin Urine: NEGATIVE
Hgb urine dipstick: NEGATIVE
Ketones, ur: NEGATIVE
Leukocytes,Ua: NEGATIVE
Nitrite: NEGATIVE
Specific Gravity, Urine: 1.015 (ref 1.000–1.030)
Total Protein, Urine: NEGATIVE
Urine Glucose: NEGATIVE
Urobilinogen, UA: 0.2 (ref 0.0–1.0)
pH: 6 (ref 5.0–8.0)

## 2019-06-08 LAB — LIPID PANEL
Cholesterol: 190 mg/dL (ref 0–200)
HDL: 54.8 mg/dL (ref >39.00)
LDL Cholesterol: 113 mg/dL — ABNORMAL HIGH (ref 0–99)
NonHDL: 134.99
Total CHOL/HDL Ratio: 3
Triglycerides: 111 mg/dL (ref 0.0–149.0)
VLDL: 22.2 mg/dL (ref 0.0–40.0)

## 2019-06-08 LAB — CBC
HCT: 40 % (ref 39.0–52.0)
Hemoglobin: 13.8 g/dL (ref 13.0–17.0)
MCHC: 34.5 g/dL (ref 30.0–36.0)
MCV: 95.5 fl (ref 78.0–100.0)
Platelets: 285 10*3/uL (ref 150.0–400.0)
RBC: 4.18 Mil/uL — ABNORMAL LOW (ref 4.22–5.81)
RDW: 12.9 % (ref 11.5–15.5)
WBC: 5.4 10*3/uL (ref 4.0–10.5)

## 2019-06-08 LAB — B12 AND FOLATE PANEL
Folate: 19.6 ng/mL (ref >5.9)
Vitamin B-12: 257 pg/mL (ref 211–911)

## 2019-06-08 LAB — PSA: PSA: 0.68 ng/mL (ref 0.10–4.00)

## 2019-06-08 LAB — VITAMIN D 25 HYDROXY (VIT D DEFICIENCY, FRACTURES): VITD: 35.96 ng/mL (ref 30.00–100.00)

## 2019-06-08 NOTE — Patient Instructions (Signed)
Health Maintenance, Male Adopting a healthy lifestyle and getting preventive care are important in promoting health and wellness. Ask your health care provider about:  The right schedule for you to have regular tests and exams.  Things you can do on your own to prevent diseases and keep yourself healthy. What should I know about diet, weight, and exercise? Eat a healthy diet   Eat a diet that includes plenty of vegetables, fruits, low-fat dairy products, and lean protein.  Do not eat a lot of foods that are high in solid fats, added sugars, or sodium. Maintain a healthy weight Body mass index (BMI) is a measurement that can be used to identify possible weight problems. It estimates body fat based on height and weight. Your health care provider can help determine your BMI and help you achieve or maintain a healthy weight. Get regular exercise Get regular exercise. This is one of the most important things you can do for your health. Most adults should:  Exercise for at least 150 minutes each week. The exercise should increase your heart rate and make you sweat (moderate-intensity exercise).  Do strengthening exercises at least twice a week. This is in addition to the moderate-intensity exercise.  Spend less time sitting. Even light physical activity can be beneficial. Watch cholesterol and blood lipids Have your blood tested for lipids and cholesterol at 63 years of age, then have this test every 5 years. You may need to have your cholesterol levels checked more often if:  Your lipid or cholesterol levels are high.  You are older than 63 years of age.  You are at high risk for heart disease. What should I know about cancer screening? Many types of cancers can be detected early and may often be prevented. Depending on your health history and family history, you may need to have cancer screening at various ages. This may include screening for:  Colorectal cancer.  Prostate  cancer.  Skin cancer.  Lung cancer. What should I know about heart disease, diabetes, and high blood pressure? Blood pressure and heart disease  High blood pressure causes heart disease and increases the risk of stroke. This is more likely to develop in people who have high blood pressure readings, are of African descent, or are overweight.  Talk with your health care provider about your target blood pressure readings.  Have your blood pressure checked: ? Every 3-5 years if you are 18-39 years of age. ? Every year if you are 40 years old or older.  If you are between the ages of 65 and 75 and are a current or former smoker, ask your health care provider if you should have a one-time screening for abdominal aortic aneurysm (AAA). Diabetes Have regular diabetes screenings. This checks your fasting blood sugar level. Have the screening done:  Once every three years after age 45 if you are at a normal weight and have a low risk for diabetes.  More often and at a younger age if you are overweight or have a high risk for diabetes. What should I know about preventing infection? Hepatitis B If you have a higher risk for hepatitis B, you should be screened for this virus. Talk with your health care provider to find out if you are at risk for hepatitis B infection. Hepatitis C Blood testing is recommended for:  Everyone born from 1945 through 1965.  Anyone with known risk factors for hepatitis C. Sexually transmitted infections (STIs)  You should be screened each year   for STIs, including gonorrhea and chlamydia, if: ? You are sexually active and are younger than 63 years of age. ? You are older than 63 years of age and your health care provider tells you that you are at risk for this type of infection. ? Your sexual activity has changed since you were last screened, and you are at increased risk for chlamydia or gonorrhea. Ask your health care provider if you are at risk.  Ask your  health care provider about whether you are at high risk for HIV. Your health care provider may recommend a prescription medicine to help prevent HIV infection. If you choose to take medicine to prevent HIV, you should first get tested for HIV. You should then be tested every 3 months for as long as you are taking the medicine. Follow these instructions at home: Lifestyle  Do not use any products that contain nicotine or tobacco, such as cigarettes, e-cigarettes, and chewing tobacco. If you need help quitting, ask your health care provider.  Do not use street drugs.  Do not share needles.  Ask your health care provider for help if you need support or information about quitting drugs. Alcohol use  Do not drink alcohol if your health care provider tells you not to drink.  If you drink alcohol: ? Limit how much you have to 0-2 drinks a day. ? Be aware of how much alcohol is in your drink. In the [place] one drink equals one 12 oz bottle of beer (355 mL), one 5 oz glass of wine (148 mL), or one 1 oz glass of hard liquor (44 mL). General instructions  Schedule regular health, dental, and eye exams.  Stay current with your vaccines.  Tell your health care provider if: ? You often feel depressed. ? You have ever been abused or do not feel safe at home. Summary  Adopting a healthy lifestyle and getting preventive care are important in promoting health and wellness.  Follow your health care provider's instructions about healthy diet, exercising, and getting tested or screened for diseases.  Follow your health care provider's instructions on monitoring your cholesterol and blood pressure. This information is not intended to replace advice given to you by your health care provider. Make sure you discuss any questions you have with your health care provider. Document Revised: 03/15/2018 Document Reviewed: 03/15/2018 Elsevier Patient Education  2020 [employer]  Preventive Care 40-64 Years  Old, Male Preventive care refers to lifestyle choices and visits with your health care provider that can promote health and wellness. This includes:  A yearly physical exam. This is also called an annual well check.  Regular dental and eye exams.  Immunizations.  Screening for certain conditions.  Healthy lifestyle choices, such as eating a healthy diet, getting regular exercise, not using drugs or products that contain nicotine and tobacco, and limiting alcohol use. What can I expect for my preventive care visit? Physical exam Your health care provider will check:  Height and weight. These may be used to calculate body mass index (BMI), which is a measurement that tells if you are at a healthy weight.  Heart rate and blood pressure.  Your skin for abnormal spots. Counseling Your health care provider may ask you questions about:  Alcohol, tobacco, and drug use.  Emotional well-being.  Home and relationship well-being.  Sexual activity.  Eating habits.  Work and work environment. What immunizations do I need?  Influenza (flu) vaccine  This is recommended every year. Tetanus, diphtheria,   and pertussis (Tdap) vaccine  You may need a Td booster every 10 years. Varicella (chickenpox) vaccine  You may need this vaccine if you have not already been vaccinated. Zoster (shingles) vaccine  You may need this after age 63. Measles, mumps, and rubella (MMR) vaccine  You may need at least one dose of MMR if you were born in 1957 or later. You may also need a second dose. Pneumococcal conjugate (PCV13) vaccine  You may need this if you have certain conditions and were not previously vaccinated. Pneumococcal polysaccharide (PPSV23) vaccine  You may need one or two doses if you smoke cigarettes or if you have certain conditions. Meningococcal conjugate (MenACWY) vaccine  You may need this if you have certain conditions. Hepatitis A vaccine  You may need this if you have  certain conditions or if you travel or work in places where you may be exposed to hepatitis A. Hepatitis B vaccine  You may need this if you have certain conditions or if you travel or work in places where you may be exposed to hepatitis B. Haemophilus influenzae type b (Hib) vaccine  You may need this if you have certain risk factors. Human papillomavirus (HPV) vaccine  If recommended by your health care provider, you may need three doses over 6 months. You may receive vaccines as individual doses or as more than one vaccine together in one shot (combination vaccines). Talk with your health care provider about the risks and benefits of combination vaccines. What tests do I need? Blood tests  Lipid and cholesterol levels. These may be checked every 5 years, or more frequently if you are over 68 years old.  Hepatitis C test.  Hepatitis B test. Screening  Lung cancer screening. You may have this screening every year starting at age 78 if you have a 30-pack-year history of smoking and currently smoke or have quit within the past 15 years.  Prostate cancer screening. Recommendations will vary depending on your family history and other risks.  Colorectal cancer screening. All adults should have this screening starting at age 38 and continuing until age 22. Your health care provider may recommend screening at age 73 if you are at increased risk. You will have tests every 1-10 years, depending on your results and the type of screening test.  Diabetes screening. This is done by checking your blood sugar (glucose) after you have not eaten for a while (fasting). You may have this done every 1-3 years.  Sexually transmitted disease (STD) testing. Follow these instructions at home: Eating and drinking  Eat a diet that includes fresh fruits and vegetables, whole grains, lean protein, and low-fat dairy products.  Take vitamin and mineral supplements as recommended by your health care  provider.  Do not drink alcohol if your health care provider tells you not to drink.  If you drink alcohol: ? Limit how much you have to 0-2 drinks a day. ? Be aware of how much alcohol is in your drink. In the [place] one drink equals one 12 oz bottle of beer (355 mL), one 5 oz glass of wine (148 mL), or one 1 oz glass of hard liquor (44 mL). Lifestyle  Take daily care of your teeth and gums.  Stay active. Exercise for at least 30 minutes on 5 or more days each week.  Do not use any products that contain nicotine or tobacco, such as cigarettes, e-cigarettes, and chewing tobacco. If you need help quitting, ask your health care provider.  If  you are sexually active, practice safe sex. Use a condom or other form of protection to prevent STIs (sexually transmitted infections).  Talk with your health care provider about taking a low-dose aspirin every day starting at age 50. What's next?  Go to your health care provider once a year for a well check visit.  Ask your health care provider how often you should have your eyes and teeth checked.  Stay up to date on all vaccines. This information is not intended to replace advice given to you by your health care provider. Make sure you discuss any questions you have with your health care provider. Document Revised: 03/16/2018 Document Reviewed: 03/16/2018 Elsevier Patient Education  2020 Elsevier Inc.  

## 2019-06-08 NOTE — Progress Notes (Signed)
Established Patient Office Visit  Subjective:  Patient ID: Jacob Rodriguez, male    DOB: November 11, 1955  Age: 64 y.o. MRN: [id number]  CC:  Chief Complaint  Patient presents with  . Annual Exam    CPE, no concerns.     HPI Jacob Rodriguez presents for a complete physical and follow-up of his hypertension and insomnia.  Blood pressure is well controlled with the carvedilol and he is having no issues taking it.  Tobramycin did clear up his conjunctivitis on the right.  He is using Ambien on a strictly as needed basis mostly when he travels.  He sees the dentist twice yearly.  Up-to-date on his colonoscopy.  Continues to work mostly from home.  Is looking forward to receiving his Covid vaccine.  Okay  Past Medical History:  Diagnosis Date  . Allergy    as a child  . Hypertension     Past Surgical History:  Procedure Laterality Date  . COLONOSCOPY    . HYDROCELE EXCISION  01/09/1993  . KNEE ARTHROSCOPY     rt x3  . KNEE ARTHROSCOPY     x2 on left  . POLYPECTOMY    . ROTATOR CUFF REPAIR      Family History  Problem Relation Age of Onset  . Colon cancer Neg Hx   . Diabetes Neg Hx   . Esophageal cancer Neg Hx   . Rectal cancer Neg Hx   . Stomach cancer Neg Hx     Social History   Socioeconomic History  . Marital status: Married    Spouse name: Not on file  . Number of children: Not on file  . Years of education: Not on file  . Highest education level: Not on file  Occupational History  . Not on file  Tobacco Use  . Smoking status: Never Smoker  . Smokeless tobacco: Never Used  Substance and Sexual Activity  . Alcohol use: Yes    Alcohol/week: 7.0 standard drinks    Types: 7 Standard drinks or equivalent per week    Comment: 4 times a week couple drinks  . Drug use: No  . Sexual activity: Not on file  Other Topics Concern  . Not on file  Social History Narrative  . Not on file   Social Determinants of Health   Financial Resource Strain:   . Difficulty of  Paying Living Expenses: Not on file  Food Insecurity:   . Worried About Charity fundraiser in the Last Year: Not on file  . Ran Out of Food in the Last Year: Not on file  Transportation Needs:   . Lack of Transportation (Medical): Not on file  . Lack of Transportation (Non-Medical): Not on file  Physical Activity:   . Days of Exercise per Week: Not on file  . Minutes of Exercise per Session: Not on file  Stress:   . Feeling of Stress : Not on file  Social Connections:   . Frequency of Communication with Friends and Family: Not on file  . Frequency of Social Gatherings with Friends and Family: Not on file  . Attends Religious Services: Not on file  . Active Member of Clubs or Organizations: Not on file  . Attends Archivist Meetings: Not on file  . Marital Status: Not on file  Intimate Partner Violence:   . Fear of Current or Ex-Partner: Not on file  . Emotionally Abused: Not on file  . Physically Abused: Not on file  .  Sexually Abused: Not on file    Outpatient Medications Prior to Visit  Medication Sig Dispense Refill  . carvedilol (COREG CR) 20 MG 24 hr capsule Take 1 capsule (20 mg total) by mouth at bedtime. 30 capsule 3  . glucosamine-chondroitin 500-400 MG tablet Take 1 tablet by mouth daily.    . Multiple Vitamin (MULTIVITAMIN) tablet Take 1 tablet by mouth daily.    Marland Kitchen PROPECIA 1 MG tablet Take 1 tablet (1 mg total) by mouth daily. 30 tablet 3  . TURMERIC PO Take 1 tablet by mouth daily.    Marland Kitchen VITAMIN D PO Take 1 tablet by mouth daily.    Marland Kitchen doxycycline (VIBRA-TABS) 100 MG tablet Take 1 tablet (100 mg total) by mouth 2 (two) times daily. (Patient not taking: Reported on 05/23/2019) 20 tablet 0  . tobramycin (TOBREX) 0.3 % ophthalmic solution Place 2 drops into the right eye every 6 (six) hours. (Patient not taking: Reported on 06/08/2019) 5 mL 0  . zolpidem (AMBIEN) 10 MG tablet Take 1 tablet (10 mg total) by mouth at bedtime as needed for sleep. 30 tablet 0   No  facility-administered medications prior to visit.    No Known Allergies  ROS Review of Systems  Constitutional: Negative.   HENT: Negative.   Eyes: Negative for photophobia and visual disturbance.  Respiratory: Negative.   Cardiovascular: Negative.   Gastrointestinal: Negative.   Endocrine: Negative for polyphagia and polyuria.  Genitourinary: Negative for difficulty urinating, frequency and urgency.  Musculoskeletal: Negative for gait problem and joint swelling.  Skin: Negative for pallor.  Allergic/Immunologic: Negative for immunocompromised state.  Neurological: Negative for light-headedness and headaches.  Hematological: Does not bruise/bleed easily.  Psychiatric/Behavioral: Negative.    Depression screen [facility] 2/9 06/08/2019 04/04/2018  Decreased Interest 0 0  Down, Depressed, Hopeless 0 1  PHQ - 2 Score 0 1  Altered sleeping 0 1  Tired, decreased energy 0 0  Change in appetite 1 0  Feeling bad or failure about yourself  0 1  Trouble concentrating 0 0  Moving slowly or fidgety/restless 0 1  Suicidal thoughts 0 0  PHQ-9 Score 1 4  Difficult doing work/chores Not difficult at all -      Objective:    Physical Exam  Constitutional: He is oriented to person, place, and time. He appears well-developed and well-nourished. No distress.  HENT:  Head: Normocephalic and atraumatic.  Right Ear: External ear normal.  Left Ear: External ear normal.  Eyes: Conjunctivae are normal. Right eye exhibits no discharge. Left eye exhibits no discharge. No scleral icterus.  Neck: No JVD present. No tracheal deviation present. No thyromegaly present.  Cardiovascular: Normal rate, regular rhythm and normal heart sounds.  Pulmonary/Chest: Effort normal and breath sounds normal. No stridor.  Abdominal: Bowel sounds are normal. He exhibits no distension and no mass. There is no abdominal tenderness. There is no rebound and no guarding.  Genitourinary: Rectum:     Guaiac result negative.      No rectal mass, anal fissure, tenderness, external hemorrhoid, internal hemorrhoid or abnormal anal tone.  Prostate is not enlarged and not tender.  Musculoskeletal:        General: No edema.  Lymphadenopathy:    He has no cervical adenopathy.  Neurological: He is alert and oriented to person, place, and time.  Skin: Skin is warm and dry. He is not diaphoretic.  Psychiatric: He has a normal mood and affect. His behavior is normal.    BP 126/78  Pulse (!) 58   Temp (!) 96.9 F (36.1 C) (Tympanic)   Ht 6\' 1"  (1.854 m)   Wt 226 lb 11.2 oz (102.8 kg)   SpO2 98%   BMI 29.91 kg/m  Wt Readings from Last 3 Encounters:  06/08/19 226 lb 11.2 oz (102.8 kg)  05/23/19 210 lb (95.3 kg)  03/12/19 228 lb 3.2 oz (103.5 kg)     Health Maintenance Due  Topic Date Due  . Hepatitis C Screening  09/05/1955  . HIV Screening  12/10/1971    There are no preventive care reminders to display for this patient.  No results found for: TSH Lab Results  Component Value Date   WBC 4.3 08/20/2018   HGB 12.0 (L) 08/20/2018   HCT 36.1 (L) 08/20/2018   MCV 97.3 08/20/2018   PLT 202 08/20/2018   Lab Results  Component Value Date   NA 142 08/20/2018   K 3.9 08/20/2018   CO2 18 (L) 08/20/2018   GLUCOSE 101 (H) 08/20/2018   BUN 14 08/20/2018   CREATININE 0.68 08/20/2018   BILITOT 1.3 (H) 06/06/2018   ALKPHOS 59 06/06/2018   AST 14 06/06/2018   ALT 22 06/06/2018   PROT 6.4 06/06/2018   ALBUMIN 4.3 06/06/2018   CALCIUM 8.6 (L) 08/20/2018   ANIONGAP 10 08/20/2018   GFR 129.26 06/06/2018   Lab Results  Component Value Date   CHOL 192 06/06/2018   Lab Results  Component Value Date   HDL 59.10 06/06/2018   Lab Results  Component Value Date   LDLCALC 112 (H) 06/06/2018   Lab Results  Component Value Date   TRIG 105.0 06/06/2018   Lab Results  Component Value Date   CHOLHDL 3 06/06/2018   No results found for: HGBA1C    Assessment & Plan:   Problem List Items Addressed This  Visit      Other   Vitamin D deficiency   Relevant Orders   VITAMIN D 25 Hydroxy (Vit-D Deficiency, Fractures)   Anemia   Relevant Orders   CBC   Iron, TIBC and Ferritin Panel   B12 and Folate Panel   Encounter for hepatitis C screening test for low risk patient - Primary   Relevant Orders   Hepatitis C antibody      No orders of the defined types were placed in this encounter.   Follow-up: Return in about 1 year (around 06/07/2020), or if symptoms worsen or fail to improve.  Patient was given information on health maintenance and disease prevention.  Today was a complete physical and CMP, lipid profile and PSA were also checked today.  Somehow they did not crossover.  We will continue medicines for his blood pressure and insomnia.  Libby Maw, MD

## 2019-06-11 LAB — HIV ANTIBODY (ROUTINE TESTING W REFLEX): HIV 1&2 Ab, 4th Generation: NONREACTIVE

## 2019-06-11 LAB — IRON,TIBC AND FERRITIN PANEL
%SAT: 48 % (calc) (ref 20–48)
Ferritin: 401 ng/mL — ABNORMAL HIGH (ref 24–380)
Iron: 158 ug/dL (ref 50–180)
TIBC: 328 mcg/dL (calc) (ref 250–425)

## 2019-06-11 LAB — HEPATITIS C ANTIBODY
Hepatitis C Ab: NONREACTIVE
SIGNAL TO CUT-OFF: 0.01 (ref <1.00)

## 2019-06-26 ENCOUNTER — Other Ambulatory Visit: Payer: Self-pay | Admitting: Family Medicine

## 2019-06-26 DIAGNOSIS — I10 Essential (primary) hypertension: Secondary | ICD-10-CM

## 2019-12-13 ENCOUNTER — Other Ambulatory Visit: Payer: Self-pay | Admitting: Family Medicine

## 2019-12-13 DIAGNOSIS — I10 Essential (primary) hypertension: Secondary | ICD-10-CM

## 2020-02-19 ENCOUNTER — Other Ambulatory Visit: Payer: Self-pay | Admitting: Family Medicine

## 2020-02-19 DIAGNOSIS — F5102 Adjustment insomnia: Secondary | ICD-10-CM

## 2020-03-18 ENCOUNTER — Other Ambulatory Visit: Payer: Self-pay | Admitting: Family Medicine

## 2020-03-18 DIAGNOSIS — L659 Nonscarring hair loss, unspecified: Secondary | ICD-10-CM

## 2020-06-16 ENCOUNTER — Ambulatory Visit (INDEPENDENT_AMBULATORY_CARE_PROVIDER_SITE_OTHER): Payer: 59 | Admitting: Family Medicine

## 2020-06-16 ENCOUNTER — Encounter: Payer: Self-pay | Admitting: Family Medicine

## 2020-06-16 ENCOUNTER — Other Ambulatory Visit: Payer: Self-pay

## 2020-06-16 VITALS — BP 138/78 | HR 55 | Temp 97.2°F | Ht 73.0 in | Wt 221.2 lb

## 2020-06-16 DIAGNOSIS — I10 Essential (primary) hypertension: Secondary | ICD-10-CM | POA: Diagnosis not present

## 2020-06-16 DIAGNOSIS — F5102 Adjustment insomnia: Secondary | ICD-10-CM

## 2020-06-16 DIAGNOSIS — E559 Vitamin D deficiency, unspecified: Secondary | ICD-10-CM

## 2020-06-16 DIAGNOSIS — Z0000 Encounter for general adult medical examination without abnormal findings: Secondary | ICD-10-CM

## 2020-06-16 DIAGNOSIS — E538 Deficiency of other specified B group vitamins: Secondary | ICD-10-CM

## 2020-06-16 LAB — LIPID PANEL
Cholesterol: 179 mg/dL (ref 0–200)
HDL: 59.1 mg/dL (ref >39.00)
LDL Cholesterol: 96 mg/dL (ref 0–99)
NonHDL: 120.2
Total CHOL/HDL Ratio: 3
Triglycerides: 120 mg/dL (ref 0.0–149.0)
VLDL: 24 mg/dL (ref 0.0–40.0)

## 2020-06-16 LAB — COMPREHENSIVE METABOLIC PANEL
ALT: 19 U/L (ref 0–53)
AST: 17 U/L (ref 0–37)
Albumin: 4.3 g/dL (ref 3.5–5.2)
Alkaline Phosphatase: 62 U/L (ref 39–117)
BUN: 11 mg/dL (ref 6–23)
CO2: 28 mEq/L (ref 19–32)
Calcium: 9.6 mg/dL (ref 8.4–10.5)
Chloride: 105 mEq/L (ref 96–112)
Creatinine, Ser: 0.68 mg/dL (ref 0.40–1.50)
GFR: 98.92 mL/min (ref >60.00)
Glucose, Bld: 103 mg/dL — ABNORMAL HIGH (ref 70–99)
Potassium: 4.5 mEq/L (ref 3.5–5.1)
Sodium: 140 mEq/L (ref 135–145)
Total Bilirubin: 1.2 mg/dL (ref 0.2–1.2)
Total Protein: 6.8 g/dL (ref 6.0–8.3)

## 2020-06-16 LAB — URINALYSIS, ROUTINE W REFLEX MICROSCOPIC
Bilirubin Urine: NEGATIVE
Hgb urine dipstick: NEGATIVE
Ketones, ur: NEGATIVE
Leukocytes,Ua: NEGATIVE
Nitrite: NEGATIVE
RBC / HPF: NONE SEEN (ref 0–?)
Specific Gravity, Urine: <=1.005 — AB (ref 1.000–1.030)
Total Protein, Urine: NEGATIVE
Urine Glucose: NEGATIVE
Urobilinogen, UA: 0.2 (ref 0.0–1.0)
pH: 6.5 (ref 5.0–8.0)

## 2020-06-16 LAB — CBC
HCT: 39.9 % (ref 39.0–52.0)
Hemoglobin: 13.9 g/dL (ref 13.0–17.0)
MCHC: 34.8 g/dL (ref 30.0–36.0)
MCV: 94.5 fl (ref 78.0–100.0)
Platelets: 284 10*3/uL (ref 150.0–400.0)
RBC: 4.22 Mil/uL (ref 4.22–5.81)
RDW: 13 % (ref 11.5–15.5)
WBC: 6.4 10*3/uL (ref 4.0–10.5)

## 2020-06-16 LAB — VITAMIN D 25 HYDROXY (VIT D DEFICIENCY, FRACTURES): VITD: 38.14 ng/mL (ref 30.00–100.00)

## 2020-06-16 LAB — PSA: PSA: 1.02 ng/mL (ref 0.10–4.00)

## 2020-06-16 LAB — VITAMIN B12: Vitamin B-12: 260 pg/mL (ref 211–911)

## 2020-06-16 MED ORDER — ESZOPICLONE 1 MG PO TABS: 1.0000 mg | ORAL_TABLET | Freq: Every evening | ORAL | 1 refills | Status: DC | PRN

## 2020-06-16 NOTE — Patient Instructions (Signed)
Health Maintenance, Male Adopting a healthy lifestyle and getting preventive care are important in promoting health and wellness. Ask your health care provider about:  The right schedule for you to have regular tests and exams.  Things you can do on your own to prevent diseases and keep yourself healthy. What should I know about diet, weight, and exercise? Eat a healthy diet  Eat a diet that includes plenty of vegetables, fruits, low-fat dairy products, and lean protein.  Do not eat a lot of foods that are high in solid fats, added sugars, or sodium.   Maintain a healthy weight Body mass index (BMI) is a measurement that can be used to identify possible weight problems. It estimates body fat based on height and weight. Your health care provider can help determine your BMI and help you achieve or maintain a healthy weight. Get regular exercise Get regular exercise. This is one of the most important things you can do for your health. Most adults should:  Exercise for at least 150 minutes each week. The exercise should increase your heart rate and make you sweat (moderate-intensity exercise).  Do strengthening exercises at least twice a week. This is in addition to the moderate-intensity exercise.  Spend less time sitting. Even light physical activity can be beneficial. Watch cholesterol and blood lipids Have your blood tested for lipids and cholesterol at 64 years of age, then have this test every 5 years. You may need to have your cholesterol levels checked more often if:  Your lipid or cholesterol levels are high.  You are older than 64 years of age.  You are at high risk for heart disease. What should I know about cancer screening? Many types of cancers can be detected early and may often be prevented. Depending on your health history and family history, you may need to have cancer screening at various ages. This may include screening for:  Colorectal cancer.  Prostate  cancer.  Skin cancer.  Lung cancer. What should I know about heart disease, diabetes, and high blood pressure? Blood pressure and heart disease  High blood pressure causes heart disease and increases the risk of stroke. This is more likely to develop in people who have high blood pressure readings, are of African descent, or are overweight.  Talk with your health care provider about your target blood pressure readings.  Have your blood pressure checked: ? Every 3-5 years if you are 18-39 years of age. ? Every year if you are 40 years old or older.  If you are between the ages of 65 and 75 and are a current or former smoker, ask your health care provider if you should have a one-time screening for abdominal aortic aneurysm (AAA). Diabetes Have regular diabetes screenings. This checks your fasting blood sugar level. Have the screening done:  Once every three years after age 45 if you are at a normal weight and have a low risk for diabetes.  More often and at a younger age if you are overweight or have a high risk for diabetes. What should I know about preventing infection? Hepatitis B If you have a higher risk for hepatitis B, you should be screened for this virus. Talk with your health care provider to find out if you are at risk for hepatitis B infection. Hepatitis C Blood testing is recommended for:  Everyone born from 1945 through 1965.  Anyone with known risk factors for hepatitis C. Sexually transmitted infections (STIs)  You should be screened each   year for STIs, including gonorrhea and chlamydia, if: ? You are sexually active and are younger than 64 years of age. ? You are older than 64 years of age and your health care provider tells you that you are at risk for this type of infection. ? Your sexual activity has changed since you were last screened, and you are at increased risk for chlamydia or gonorrhea. Ask your health care provider if you are at risk.  Ask your  health care provider about whether you are at high risk for HIV. Your health care provider may recommend a prescription medicine to help prevent HIV infection. If you choose to take medicine to prevent HIV, you should first get tested for HIV. You should then be tested every 3 months for as long as you are taking the medicine. Follow these instructions at home: Lifestyle  Do not use any products that contain nicotine or tobacco, such as cigarettes, e-cigarettes, and chewing tobacco. If you need help quitting, ask your health care provider.  Do not use street drugs.  Do not share needles.  Ask your health care provider for help if you need support or information about quitting drugs. Alcohol use  Do not drink alcohol if your health care provider tells you not to drink.  If you drink alcohol: ? Limit how much you have to 0-2 drinks a day. ? Be aware of how much alcohol is in your drink. In the [place] one drink equals one 12 oz bottle of beer (355 mL), one 5 oz glass of wine (148 mL), or one 1 oz glass of hard liquor (44 mL). General instructions  Schedule regular health, dental, and eye exams.  Stay current with your vaccines.  Tell your health care provider if: ? You often feel depressed. ? You have ever been abused or do not feel safe at home. Summary  Adopting a healthy lifestyle and getting preventive care are important in promoting health and wellness.  Follow your health care provider's instructions about healthy diet, exercising, and getting tested or screened for diseases.  Follow your health care provider's instructions on monitoring your cholesterol and blood pressure. This information is not intended to replace advice given to you by your health care provider. Make sure you discuss any questions you have with your health care provider. Document Revised: 03/15/2018 Document Reviewed: 03/15/2018 Elsevier Patient Education  2021 [place].  Insomnia Insomnia is a  sleep disorder that makes it difficult to fall asleep or stay asleep. Insomnia can cause fatigue, low energy, difficulty concentrating, mood swings, and poor performance at work or school. There are three different ways to classify insomnia:  Difficulty falling asleep.  Difficulty staying asleep.  Waking up too early in the morning. Any type of insomnia can be long-term (chronic) or short-term (acute). Both are common. Short-term insomnia usually lasts for three months or less. Chronic insomnia occurs at least three times a week for longer than three months. What are the causes? Insomnia may be caused by another condition, situation, or substance, such as:  Anxiety.  Certain medicines.  Gastroesophageal reflux disease (GERD) or other gastrointestinal conditions.  Asthma or other breathing conditions.  Restless legs syndrome, sleep apnea, or other sleep disorders.  Chronic pain.  Menopause.  Stroke.  Abuse of alcohol, tobacco, or illegal drugs.  Mental health conditions, such as depression.  Caffeine.  Neurological disorders, such as Alzheimer's disease.  An overactive thyroid (hyperthyroidism). Sometimes, the cause of insomnia may not be known. What increases  the risk? Risk factors for insomnia include:  Gender. Women are affected more often than men.  Age. Insomnia is more common as you get older.  Stress.  Lack of exercise.  Irregular work schedule or working night shifts.  Traveling between different time zones.  Certain medical and mental health conditions. What are the signs or symptoms? If you have insomnia, the main symptom is having trouble falling asleep or having trouble staying asleep. This may lead to other symptoms, such as:  Feeling fatigued or having low energy.  Feeling nervous about going to sleep.  Not feeling rested in the morning.  Having trouble concentrating.  Feeling irritable, anxious, or depressed. How is this diagnosed? This  condition may be diagnosed based on:  Your symptoms and medical history. Your health care provider may ask about: ? Your sleep habits. ? Any medical conditions you have. ? Your mental health.  A physical exam. How is this treated? Treatment for insomnia depends on the cause. Treatment may focus on treating an underlying condition that is causing insomnia. Treatment may also include:  Medicines to help you sleep.  Counseling or therapy.  Lifestyle adjustments to help you sleep better. Follow these instructions at home: Eating and drinking  Limit or avoid alcohol, caffeinated beverages, and cigarettes, especially close to bedtime. These can disrupt your sleep.  Do not eat a large meal or eat spicy foods right before bedtime. This can lead to digestive discomfort that can make it hard for you to sleep.   Sleep habits  Keep a sleep diary to help you and your health care provider figure out what could be causing your insomnia. Write down: ? When you sleep. ? When you wake up during the night. ? How well you sleep. ? How rested you feel the next day. ? Any side effects of medicines you are taking. ? What you eat and drink.  Make your bedroom a dark, comfortable place where it is easy to fall asleep. ? Put up shades or blackout curtains to block light from outside. ? Use a white noise machine to block noise. ? Keep the temperature cool.  Limit screen use before bedtime. This includes: ? Watching TV. ? Using your smartphone, tablet, or computer.  Stick to a routine that includes going to bed and waking up at the same times every day and night. This can help you fall asleep faster. Consider making a quiet activity, such as reading, part of your nighttime routine.  Try to avoid taking naps during the day so that you sleep better at night.  Get out of bed if you are still awake after 15 minutes of trying to sleep. Keep the lights down, but try reading or doing a quiet activity. When  you feel sleepy, go back to bed.   General instructions  Take over-the-counter and prescription medicines only as told by your health care provider.  Exercise regularly, as told by your health care provider. Avoid exercise starting several hours before bedtime.  Use relaxation techniques to manage stress. Ask your health care provider to suggest some techniques that may work well for you. These may include: ? Breathing exercises. ? Routines to release muscle tension. ? Visualizing peaceful scenes.  Make sure that you drive carefully. Avoid driving if you feel very sleepy.  Keep all follow-up visits as told by your health care provider. This is important. Contact a health care provider if:  You are tired throughout the day.  You have trouble in your  daily routine due to sleepiness.  You continue to have sleep problems, or your sleep problems get worse. Get help right away if:  You have serious thoughts about hurting yourself or someone else. If you ever feel like you may hurt yourself or others, or have thoughts about taking your own life, get help right away. You can go to your nearest emergency department or call:  Your local emergency services (911 in the [place]  A suicide crisis helpline, such as the [place] at 415-094-9260. This is open 24 hours a day. Summary  Insomnia is a sleep disorder that makes it difficult to fall asleep or stay asleep.  Insomnia can be long-term (chronic) or short-term (acute).  Treatment for insomnia depends on the cause. Treatment may focus on treating an underlying condition that is causing insomnia.  Keep a sleep diary to help you and your health care provider figure out what could be causing your insomnia. This information is not intended to replace advice given to you by your health care provider. Make sure you discuss any questions you have with your health care provider. Document Revised: 01/31/2020 Document  Reviewed: 01/31/2020 Elsevier Patient Education  2021 [employer]  Preventive Care 80-91 Years Old, Male Preventive care refers to lifestyle choices and visits with your health care provider that can promote health and wellness. This includes:  A yearly physical exam. This is also called an annual wellness visit.  Regular dental and eye exams.  Immunizations.  Screening for certain conditions.  Healthy lifestyle choices, such as: ? Eating a healthy diet. ? Getting regular exercise. ? Not using drugs or products that contain nicotine and tobacco. ? Limiting alcohol use. What can I expect for my preventive care visit? Physical exam Your health care provider will check your:  Height and weight. These may be used to calculate your BMI (body mass index). BMI is a measurement that tells if you are at a healthy weight.  Heart rate and blood pressure.  Body temperature.  Skin for abnormal spots. Counseling Your health care provider may ask you questions about your:  Past medical problems.  Family's medical history.  Alcohol, tobacco, and drug use.  Emotional well-being.  Home life and relationship well-being.  Sexual activity.  Diet, exercise, and sleep habits.  Work and work Statistician.  Access to firearms. What immunizations do I need? Vaccines are usually given at various ages, according to a schedule. Your health care provider will recommend vaccines for you based on your age, medical history, and lifestyle or other factors, such as travel or where you work.   What tests do I need? Blood tests  Lipid and cholesterol levels. These may be checked every 5 years, or more often if you are over 66 years old.  Hepatitis C test.  Hepatitis B test. Screening  Lung cancer screening. You may have this screening every year starting at age 25 if you have a 30-pack-year history of smoking and currently smoke or have quit within the past 15 years.  Prostate cancer  screening. Recommendations will vary depending on your family history and other risks.  Genital exam to check for testicular cancer or hernias.  Colorectal cancer screening. ? All adults should have this screening starting at age 42 and continuing until age 73. ? Your health care provider may recommend screening at age 38 if you are at increased risk. ? You will have tests every 1-10 years, depending on your results and the type of screening  test.  Diabetes screening. ? This is done by checking your blood sugar (glucose) after you have not eaten for a while (fasting). ? You may have this done every 1-3 years.  STD (sexually transmitted disease) testing, if you are at risk. Follow these instructions at home: Eating and drinking  Eat a diet that includes fresh fruits and vegetables, whole grains, lean protein, and low-fat dairy products.  Take vitamin and mineral supplements as recommended by your health care provider.  Do not drink alcohol if your health care provider tells you not to drink.  If you drink alcohol: ? Limit how much you have to 0-2 drinks a day. ? Be aware of how much alcohol is in your drink. In the [place] one drink equals one 12 oz bottle of beer (355 mL), one 5 oz glass of wine (148 mL), or one 1 oz glass of hard liquor (44 mL).   Lifestyle  Take daily care of your teeth and gums. Brush your teeth every morning and night with fluoride toothpaste. Floss one time each day.  Stay active. Exercise for at least 30 minutes 5 or more days each week.  Do not use any products that contain nicotine or tobacco, such as cigarettes, e-cigarettes, and chewing tobacco. If you need help quitting, ask your health care provider.  Do not use drugs.  If you are sexually active, practice safe sex. Use a condom or other form of protection to prevent STIs (sexually transmitted infections).  If told by your health care provider, take low-dose aspirin daily starting at age 72.  Find  healthy ways to cope with stress, such as: ? Meditation, yoga, or listening to music. ? Journaling. ? Talking to a trusted person. ? Spending time with friends and family. Safety  Always wear your seat belt while driving or riding in a vehicle.  Do not drive: ? If you have been drinking alcohol. Do not ride with someone who has been drinking. ? When you are tired or distracted. ? While texting.  Wear a helmet and other protective equipment during sports activities.  If you have firearms in your house, make sure you follow all gun safety procedures. What's next?  Go to your health care provider once a year for an annual wellness visit.  Ask your health care provider how often you should have your eyes and teeth checked.  Stay up to date on all vaccines. This information is not intended to replace advice given to you by your health care provider. Make sure you discuss any questions you have with your health care provider. Document Revised: 12/19/2018 Document Reviewed: 03/16/2018 Elsevier Patient Education  2021 [place]. Eszopiclone tablets What is this medicine? ESZOPICLONE (es ZOE pi clone) is used to treat insomnia. This medicine helps you to fall asleep and sleep through the night. This medicine may be used for other purposes; ask your health care provider or pharmacist if you have questions. COMMON BRAND NAME(S): Lunesta What should I tell my health care provider before I take this medicine? They need to know if you have any of these conditions:  depression  history of a drug or alcohol abuse problem  liver disease  lung or breathing disease  sleep-walking, driving, eating or other activity while not fully awake after taking a sleep medicine  suicidal thoughts  an unusual or allergic reaction to eszopiclone, other medicines, foods, dyes, or preservatives  pregnant or trying to get pregnant  breast-feeding How should I use this medicine? Take  this medicine  by mouth with a glass of water. Follow the directions on the prescription label. It is better to take this medicine on an empty stomach and only when you are ready for bed. Do not take your medicine more often than directed. If you have been taking this medicine for several weeks and suddenly stop taking it, you may get unpleasant withdrawal symptoms. Your doctor or health care professional may want to gradually reduce the dose. Do not stop taking this medicine on your own. Always follow your doctor or health care professional's advice. Talk to your pediatrician regarding the use of this medicine in children. Special care may be needed. Overdosage: If you think you have taken too much of this medicine contact a poison control center or emergency room at once. NOTE: This medicine is only for you. Do not share this medicine with others. What if I miss a dose? This does not apply. This medicine should only be taken immediately before going to sleep. Do not take double or extra doses. What may interact with this medicine?  herbal medicines like kava kava, melatonin, St. John's wort and valerian  lorazepam  medicines for fungal infections like ketoconazole, fluconazole, or itraconazole  olanzapine This list may not describe all possible interactions. Give your health care provider a list of all the medicines, herbs, non-prescription drugs, or dietary supplements you use. Also tell them if you smoke, drink alcohol, or use illegal drugs. Some items may interact with your medicine. What should I watch for while using this medicine? Visit your doctor or health care professional for regular checks on your progress. Keep a regular sleep schedule by going to bed at about the same time nightly. Avoid caffeine-containing drinks in the evening hours, as caffeine can cause trouble with falling asleep. Talk to your doctor if you still have trouble sleeping. After taking this medicine, you may get up out of bed and  do an activity that you do not know you are doing. The next morning, you may have no memory of this. Activities include driving a car ("sleep-driving"), making and eating food, talking on the phone, sexual activity, and sleep-walking. Serious injuries have occurred. Stop the medicine and call your doctor right away if you find out you have done any of these activities. Do not take this medicine if you have used alcohol that evening. Do not take it if you have taken another medicine for sleep. The risk of doing these sleep-related activities is higher. Do not take this medicine unless you are able to stay in bed for a full night (7 to 8 hours) before you must be active again. You may have a decrease in mental alertness the day after use, even if you feel that you are fully awake. Tell your doctor if you will need to perform activities requiring full alertness, such as driving, the next day. Do not stand or sit up quickly after taking this medicine, especially if you are an older patient. This reduces the risk of dizzy or fainting spells. If you or your family notice any changes in your behavior, such as new or worsening depression, thoughts of harming yourself, anxiety, other unusual or disturbing thoughts, or memory loss, call your doctor right away. After you stop taking this medicine, you may have trouble falling asleep. This is called rebound insomnia. This problem usually goes away on its own after 1 or 2 nights. What side effects may I notice from receiving this medicine? Side effects that you  should report to your doctor or health care professional as soon as possible:  allergic reactions like skin rash, itching or hives, swelling of the face, lips, or tongue  changes in vision  confusion  depressed mood  feeling faint or lightheaded, falls  hallucinations  problems with balance, speaking, walking  restlessness, excitability, or feelings of agitation  unusual activities while not fully  awake like driving, eating, making phone calls Side effects that usually do not require medical attention (report to your doctor or health care professional if they continue or are bothersome):  dizziness, or daytime drowsiness, sometimes called a hangover effect  headache This list may not describe all possible side effects. Call your doctor for medical advice about side effects. You may report side effects to FDA at 1-800-FDA-1088. Where should I keep my medicine? Keep out of the reach of children. This medicine can be abused. Keep your medicine in a safe place to protect it from theft. Do not share this medicine with anyone. Selling or giving away this medicine is dangerous and against the law. This medicine may cause accidental overdose and death if taken by other adults, children, or pets. Mix any unused medicine with a substance like cat litter or coffee grounds. Then throw the medicine away in a sealed container like a sealed bag or a coffee can with a lid. Do not use the medicine after the expiration date. Store at room temperature between 15 and 30 degrees C (59 and 86 degrees F). NOTE: This sheet is a summary. It may not cover all possible information. If you have questions about this medicine, talk to your doctor, pharmacist, or health care provider.  2021 Elsevier/[employer] Standard (2017-09-16 11:57:05)

## 2020-06-16 NOTE — Progress Notes (Signed)
Established Patient Office Visit  Subjective:  Patient ID: Jacob Rodriguez, male    DOB: Mar 08, 1956  Age: 65 y.o. MRN: [id number]  CC:  Chief Complaint  Patient presents with  . Annual Exam    CPE, no concerns. Patient fasting for labs.     HPI [name] presents for a complete physical exam and follow up of his htn and insomnia. bp running in the 120s/70s. Using [place] rarely. Exercising 5 days weekly. HM up to date.  Past Medical History:  Diagnosis Date  . Allergy    as a child  . Hypertension     Past Surgical History:  Procedure Laterality Date  . COLONOSCOPY    . HYDROCELE EXCISION  01/09/1993  . KNEE ARTHROSCOPY     rt x3  . KNEE ARTHROSCOPY     x2 on left  . POLYPECTOMY    . ROTATOR CUFF REPAIR      Family History  Problem Relation Age of Onset  . Colon cancer Neg Hx   . Diabetes Neg Hx   . Esophageal cancer Neg Hx   . Rectal cancer Neg Hx   . Stomach cancer Neg Hx     Social History   Socioeconomic History  . Marital status: Married    Spouse name: Not on file  . Number of children: Not on file  . Years of education: Not on file  . Highest education level: Not on file  Occupational History  . Not on file  Tobacco Use  . Smoking status: Never Smoker  . Smokeless tobacco: Never Used  Vaping Use  . Vaping Use: Never used  Substance and Sexual Activity  . Alcohol use: Yes    Alcohol/week: 7.0 standard drinks    Types: 7 Standard drinks or equivalent per week    Comment: 4 times a week couple drinks  . Drug use: No  . Sexual activity: Not on file  Other Topics Concern  . Not on file  Social History Narrative  . Not on file   Social Determinants of Health   Financial Resource Strain: Not on file  Food Insecurity: Not on file  Transportation Needs: Not on file  Physical Activity: Not on file  Stress: Not on file  Social Connections: Not on file  Intimate Partner Violence: Not on file    Outpatient Medications Prior to Visit   Medication Sig Dispense Refill  . carvedilol (COREG CR) 20 MG 24 hr capsule TAKE 1 CAPSULE(20 MG) BY MOUTH AT BEDTIME 90 capsule 1  . glucosamine-chondroitin 500-400 MG tablet Take 1 tablet by mouth daily.    . Multiple Vitamin (MULTIVITAMIN) tablet Take 1 tablet by mouth daily.    Marland Kitchen PROPECIA 1 MG tablet TAKE 1 TABLET(1 MG) BY MOUTH DAILY 30 tablet 3  . TURMERIC PO Take 1 tablet by mouth daily.    Marland Kitchen VITAMIN Jacob PO Take 1 tablet by mouth daily.    Marland Kitchen doxycycline (VIBRA-TABS) 100 MG tablet Take 1 tablet (100 mg total) by mouth 2 (two) times daily. (Patient not taking: No sig reported) 20 tablet 0  . tobramycin (TOBREX) 0.3 % ophthalmic solution Place 2 drops into the right eye every 6 (six) hours. (Patient not taking: No sig reported) 5 mL 0   No facility-administered medications prior to visit.    No Known Allergies  ROS Review of Systems  Constitutional: Negative.   HENT: Negative.   Eyes: Negative for photophobia and visual disturbance.  Respiratory: Negative.  Cardiovascular: Negative.   Gastrointestinal: Negative.   Endocrine: Negative for polyphagia and polyuria.  Genitourinary: Negative.   Musculoskeletal: Positive for arthralgias (knees at times).  Skin: Negative for pallor and rash.  Allergic/Immunologic: Negative for immunocompromised state.  Neurological: Negative.   Hematological: Does not bruise/bleed easily.  Psychiatric/Behavioral: Positive for sleep disturbance.      Objective:    Physical Exam Vitals and nursing note reviewed.  Constitutional:      General: He is not in acute distress.    Appearance: Normal appearance. He is not ill-appearing, toxic-appearing or diaphoretic.  HENT:     Head: Normocephalic and atraumatic.     Right Ear: Tympanic membrane, ear canal and external ear normal.     Left Ear: Tympanic membrane, ear canal and external ear normal.     Mouth/Throat:     Mouth: Mucous membranes are dry.  Cardiovascular:     Rate and Rhythm: Normal  rate and regular rhythm.  Pulmonary:     Effort: Pulmonary effort is normal.     Breath sounds: Normal breath sounds.  Genitourinary:    Prostate: Not enlarged, not tender and no nodules present.     Rectum: Guaiac result negative. No mass, tenderness, anal fissure, external hemorrhoid or internal hemorrhoid. Normal anal tone.  Musculoskeletal:     Cervical back: No rigidity or tenderness.     Right lower leg: No edema.     Left lower leg: No edema.  Lymphadenopathy:     Cervical: No cervical adenopathy.  Skin:    General: Skin is warm and dry.  Neurological:     General: No focal deficit present.     Mental Status: He is alert and oriented to person, place, and time.     BP 138/78   Pulse (!) 55   Temp (!) 97.2 F (36.2 C) (Temporal)   Ht 6\' 1"  (1.854 m)   Wt 221 lb 3.2 oz (100.3 kg)   SpO2 99%   BMI 29.18 kg/m  Wt Readings from Last 3 Encounters:  06/16/20 221 lb 3.2 oz (100.3 kg)  06/08/19 226 lb 11.2 oz (102.8 kg)  05/23/19 210 lb (95.3 kg)     There are no preventive care reminders to display for this patient.  There are no preventive care reminders to display for this patient.  No results found for: TSH Lab Results  Component Value Date   WBC 5.4 06/08/2019   HGB 13.8 06/08/2019   HCT 40.0 06/08/2019   MCV 95.5 06/08/2019   PLT 285.0 06/08/2019   Lab Results  Component Value Date   NA 136 06/08/2019   K 4.3 06/08/2019   CO2 26 06/08/2019   GLUCOSE 104 (H) 06/08/2019   BUN 12 06/08/2019   CREATININE 0.71 06/08/2019   BILITOT 1.1 06/08/2019   ALKPHOS 53 06/08/2019   AST 16 06/08/2019   ALT 19 06/08/2019   PROT 6.7 06/08/2019   ALBUMIN 4.3 06/08/2019   CALCIUM 9.2 06/08/2019   ANIONGAP 10 08/20/2018   GFR 112.24 06/08/2019   Lab Results  Component Value Date   CHOL 190 06/08/2019   Lab Results  Component Value Date   HDL 54.80 06/08/2019   Lab Results  Component Value Date   LDLCALC 113 (H) 06/08/2019   Lab Results  Component Value  Date   TRIG 111.0 06/08/2019   Lab Results  Component Value Date   CHOLHDL 3 06/08/2019   No results found for: HGBA1C    Assessment & Plan:  Problem List Items Addressed This Visit      Cardiovascular and Mediastinum   Essential hypertension   Relevant Orders   CBC   Comprehensive metabolic panel   Urinalysis, Routine w reflex microscopic     Other   Adjustment insomnia   Relevant Medications   eszopiclone (LUNESTA) 1 MG TABS tablet   Vitamin Jacob deficiency - Primary   Relevant Orders   VITAMIN Jacob 25 Hydroxy (Vit-Jacob Deficiency, Fractures)    Other Visit Diagnoses    B12 deficiency       Relevant Orders   Vitamin B12   Healthcare maintenance       Relevant Orders   Lipid panel   PSA      Meds ordered this encounter  Medications  . eszopiclone (LUNESTA) 1 MG TABS tablet    Sig: Take 1 tablet (1 mg total) by mouth at bedtime as needed for sleep. Take immediately before bedtime    Dispense:  30 tablet    Refill:  1    Follow-up: Return in about 6 months (around 12/17/2020).  Trial of lunesta. Info given on insomnia and lunesta. Info givne on health maint. And disease prevention. May switch back to Ambien prn.   Libby Maw, MD

## 2020-06-27 ENCOUNTER — Other Ambulatory Visit: Payer: Self-pay | Admitting: Family Medicine

## 2020-06-27 DIAGNOSIS — I10 Essential (primary) hypertension: Secondary | ICD-10-CM

## 2020-08-07 ENCOUNTER — Other Ambulatory Visit: Payer: Self-pay | Admitting: Family Medicine

## 2020-08-07 DIAGNOSIS — L659 Nonscarring hair loss, unspecified: Secondary | ICD-10-CM

## 2020-08-07 DIAGNOSIS — B001 Herpesviral vesicular dermatitis: Secondary | ICD-10-CM

## 2020-08-07 MED ORDER — VALACYCLOVIR HCL 1 G PO TABS
500.0000 mg | ORAL_TABLET | Freq: Two times a day (BID) | ORAL | 0 refills | Status: DC
Start: 2020-08-07 — End: 2021-02-06

## 2020-08-07 MED ORDER — PROPECIA 1 MG PO TABS: ORAL_TABLET | ORAL | 3 refills | Status: DC

## 2020-08-07 NOTE — Telephone Encounter (Signed)
Patient calling for refill on pending Rx Propecia. Patient also request refill for  Valacyclovir last RF this medication 04/02/19. Last OV 06/16/2020 please advise.

## 2020-08-07 NOTE — Telephone Encounter (Signed)
Patient is calling to get a refill on his Propecia and Valacyclovir. If approved, please send to [facility] on [address] and call him at  [phone] to let him know that it has been sent in.

## 2020-10-11 IMAGING — US US SCROTUM W/ DOPPLER COMPLETE
1 series · 14 of 25 positions shown · non-contrast
Comparison: None.

CLINICAL DATA: Testicular pain and swelling times several months.

EXAM:
SCROTAL ULTRASOUND
DOPPLER ULTRASOUND OF THE TESTICLES
TECHNIQUE: Complete ultrasound examination of the testicles, epididymis, and
other scrotal structures was performed. Color and spectral Doppler
ultrasound were also utilized to evaluate blood flow to the
testicles.

[Series 1: us scrotum w/ doppler complete · 40 acquisitions, 14 frames shown]
[im 1/40]
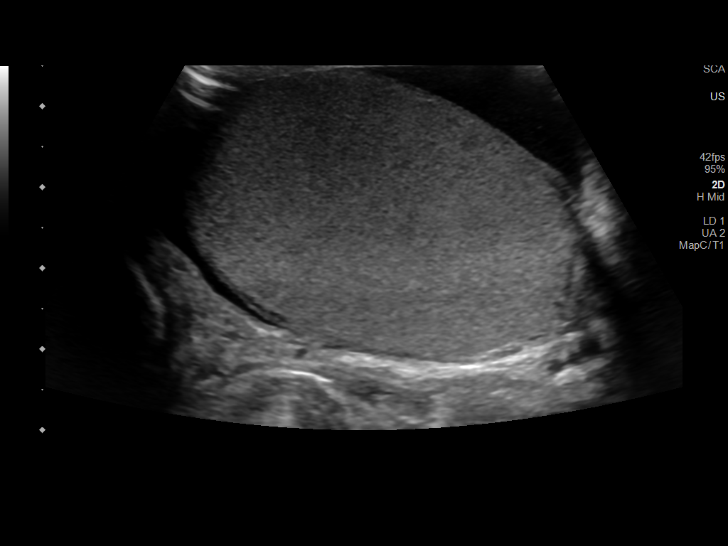
[im 4/40]
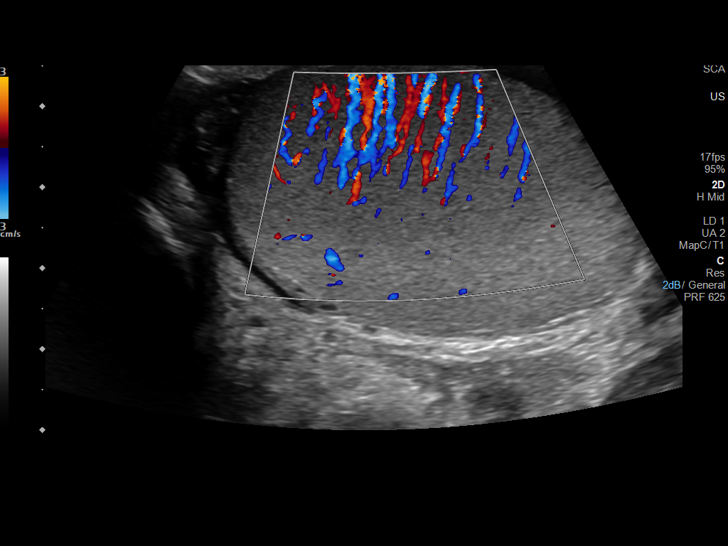
[im 7/40]
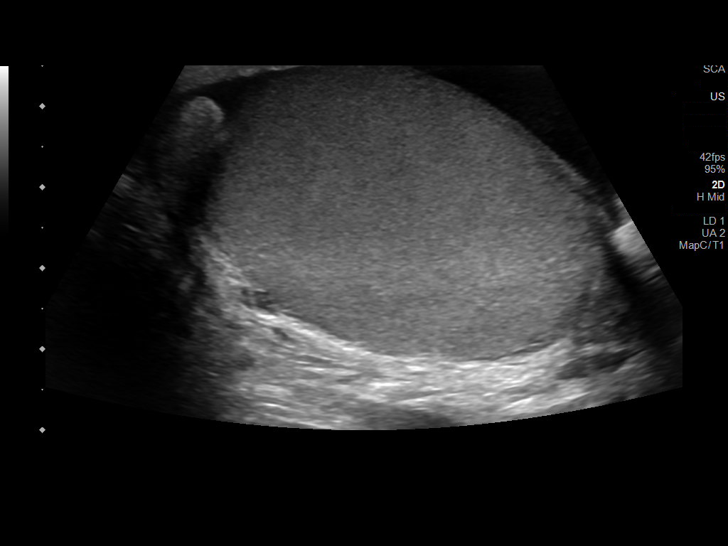
[im 10/40]
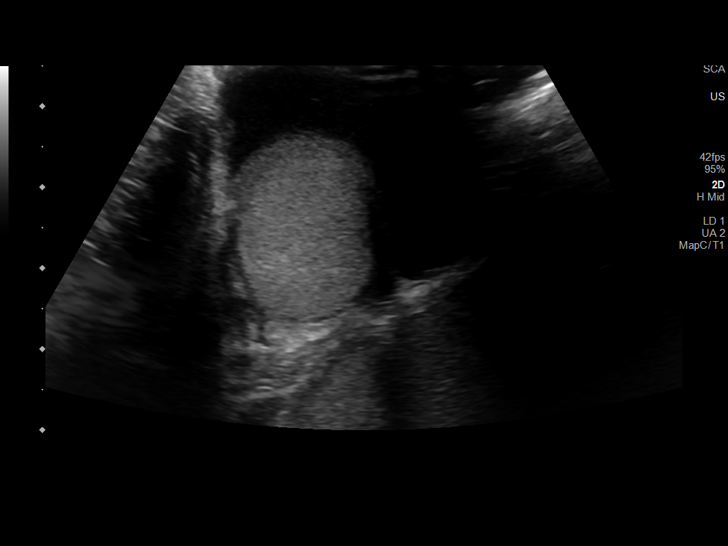
[im 14/40]
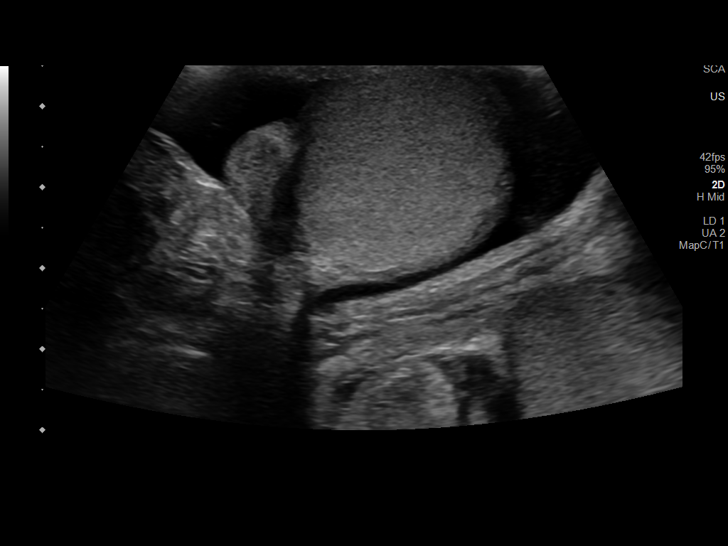
[im 15/40]
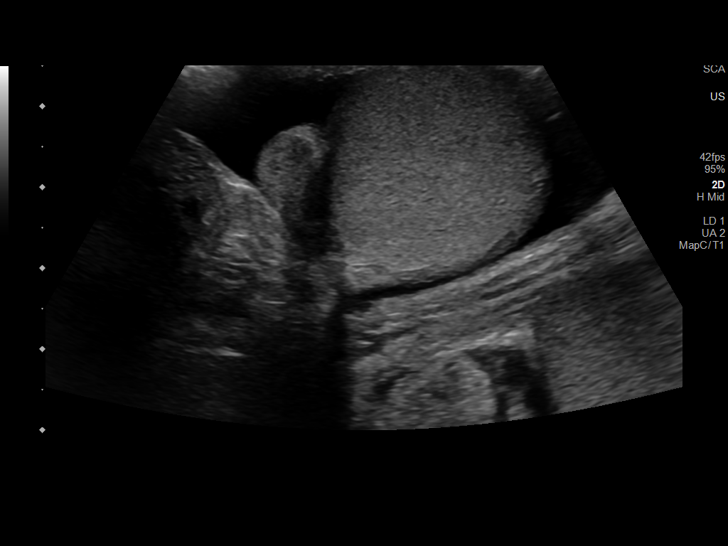
[im 18/40]
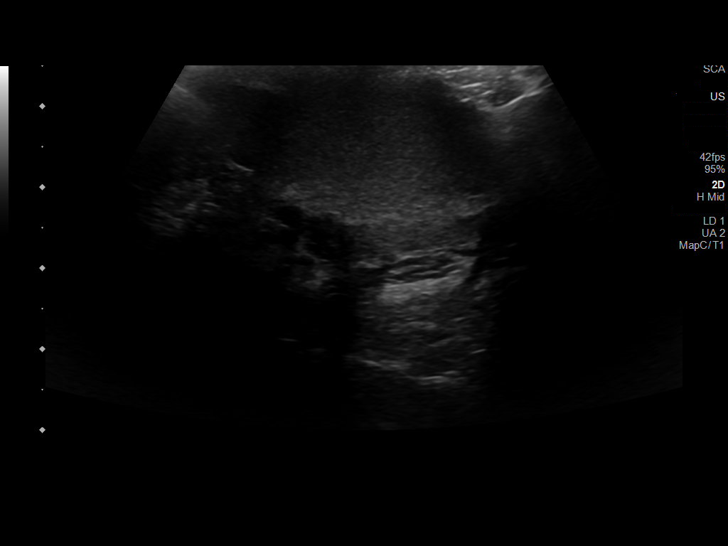
[im 22/40]
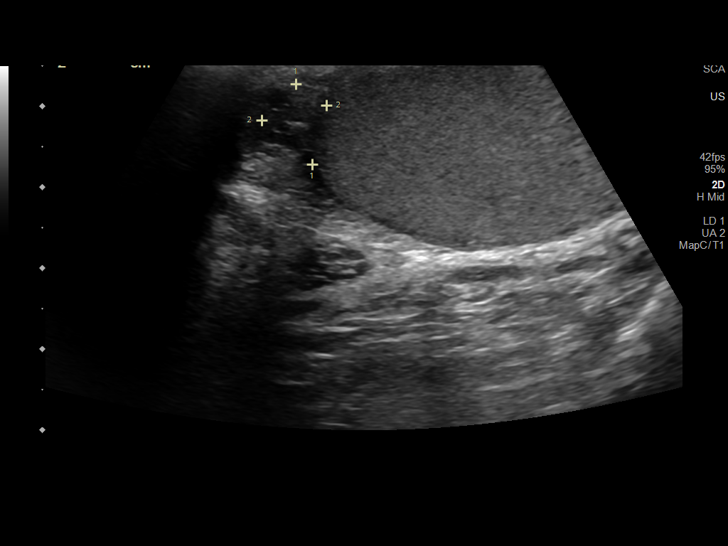
[im 25/40]
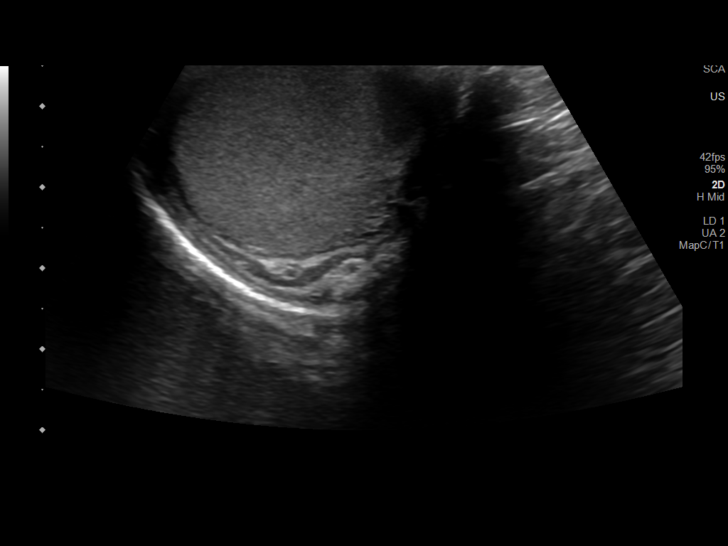
[im 27/40]
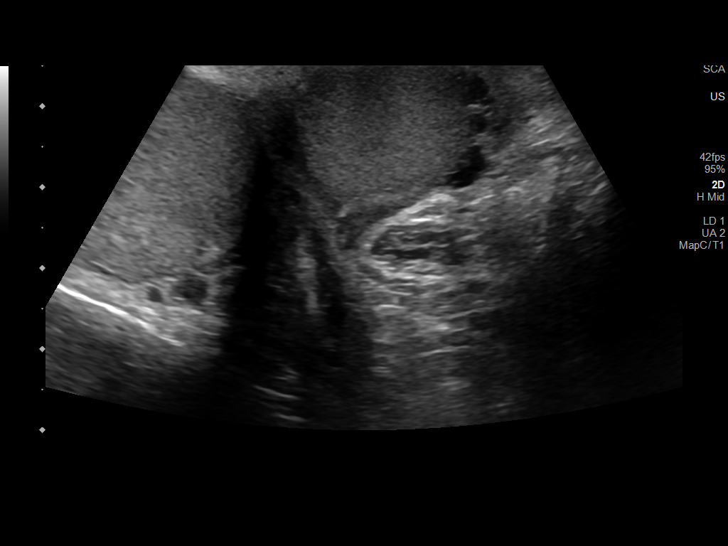
[im 30/40]
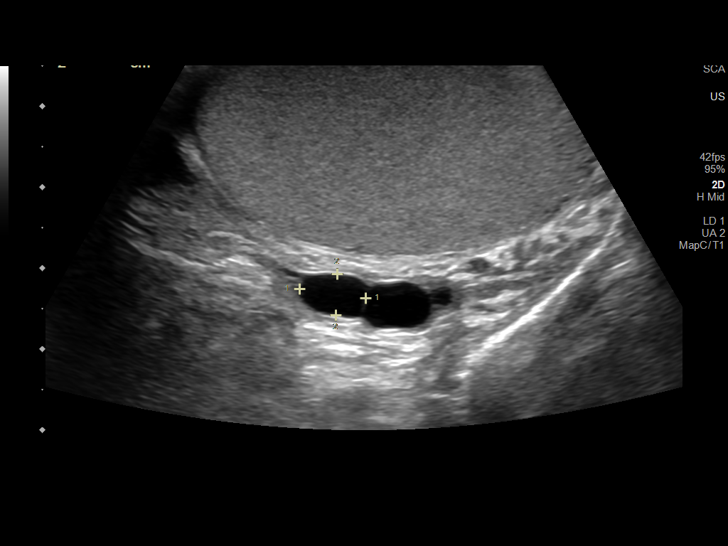
[im 33/40]
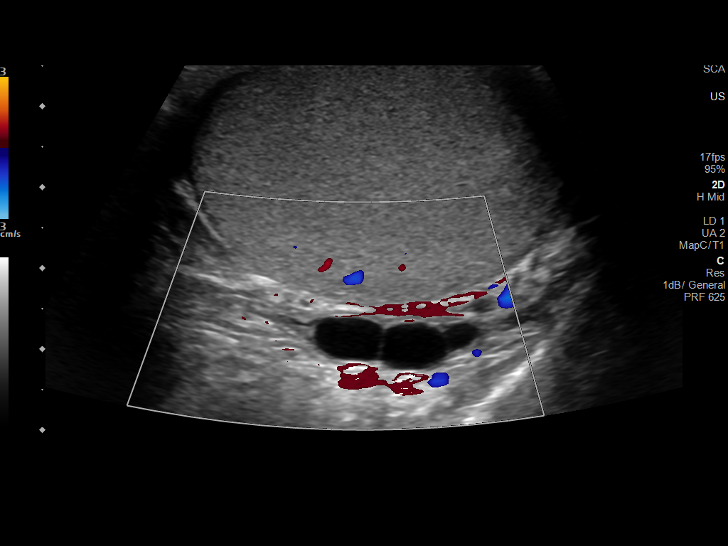
[im 36/40]
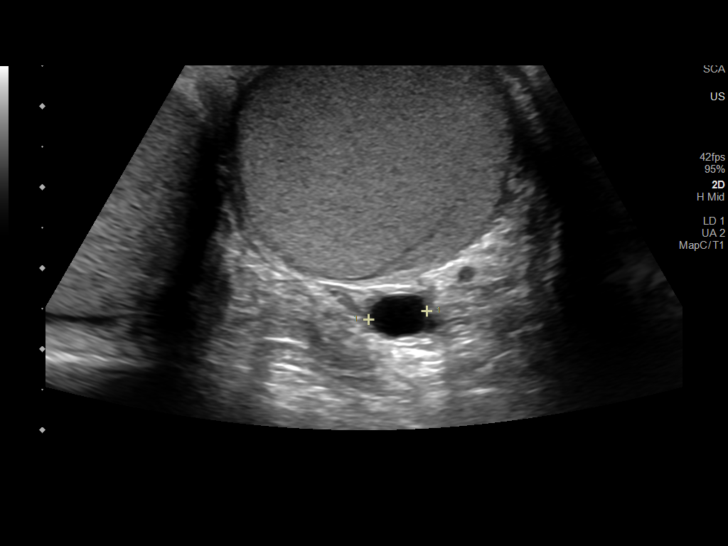
[im 40/40]
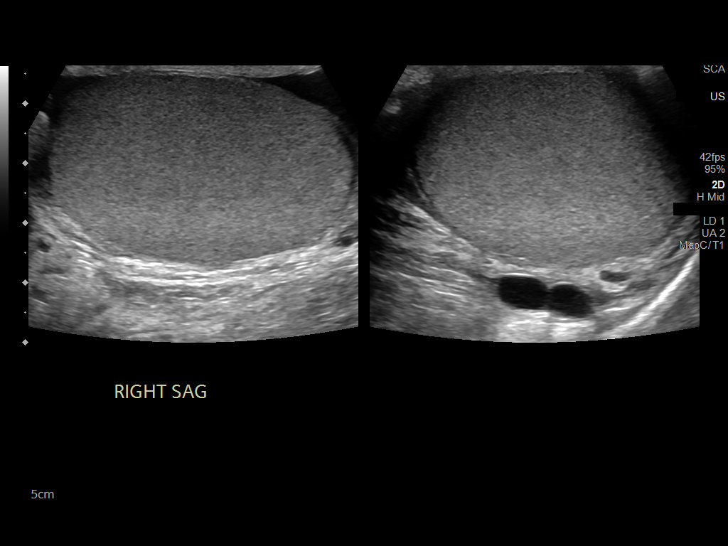

[14 of 25 positions shown; findings below may reference images not displayed]

FINDINGS: Right testicle

Measurements: 5 x 3.3 x 3 cm.  No mass or microlithiasis visualized.

Left testicle

Measurements: 4.6 x 2.9 x 2.8 cm. No mass or microlithiasis
visualized. There are small cystic lesions in the peritesticular
region. These are favored to represent scrotal tunica cysts.

Right epididymis:  Normal in size and appearance.

Left epididymis:  Normal in size and appearance.

Hydrocele:  There is a small right-sided hydrocele.

Varicocele:  None visualized.

Pulsed Doppler interrogation of both testes demonstrates normal low
resistance arterial and venous waveforms bilaterally.
IMPRESSION: 1. No acute abnormality.  No evidence for testicular torsion.
2. Small right-sided hydrocele.

## 2020-12-06 ENCOUNTER — Other Ambulatory Visit: Payer: Self-pay | Admitting: Family Medicine

## 2020-12-06 DIAGNOSIS — I10 Essential (primary) hypertension: Secondary | ICD-10-CM

## 2020-12-22 ENCOUNTER — Ambulatory Visit (INDEPENDENT_AMBULATORY_CARE_PROVIDER_SITE_OTHER): Payer: 59 | Admitting: Family Medicine

## 2020-12-22 ENCOUNTER — Other Ambulatory Visit: Payer: Self-pay

## 2020-12-22 ENCOUNTER — Encounter: Payer: Self-pay | Admitting: Family Medicine

## 2020-12-22 VITALS — BP 148/76 | HR 72 | Temp 97.4°F | Ht 73.0 in | Wt 220.4 lb

## 2020-12-22 DIAGNOSIS — I10 Essential (primary) hypertension: Secondary | ICD-10-CM

## 2020-12-22 DIAGNOSIS — F5102 Adjustment insomnia: Secondary | ICD-10-CM

## 2020-12-22 DIAGNOSIS — Z23 Encounter for immunization: Secondary | ICD-10-CM | POA: Diagnosis not present

## 2020-12-22 DIAGNOSIS — Z0000 Encounter for general adult medical examination without abnormal findings: Secondary | ICD-10-CM

## 2020-12-22 DIAGNOSIS — J301 Allergic rhinitis due to pollen: Secondary | ICD-10-CM | POA: Diagnosis not present

## 2020-12-22 NOTE — Progress Notes (Signed)
Established Patient Office Visit  Subjective:  Patient ID: Jacob Rodriguez, male    DOB: 08-18-1955  Age: 65 y.o. MRN: [id number]  CC:  Chief Complaint  Patient presents with   Follow-up    6 month f/u HTN, Insomnia. No concerns.      HPI Jacob Rodriguez presents for follow-up of insomnia, hypertension, allergy rhinitis.  Needs a flu vaccine.  Reports itchy watery eyes sneezing drainage nasal congestion.  He is taking a Claritin with some relief.  Blood pressure is running in the 120s over 80s.  He is taking his carvedilol daily without issue.  Denies headaches or blurred vision.  Jacob Rodriguez has not been helpful.  OTC preparation has been more helpful.  He is traveling again with work  Past Medical History:  Diagnosis Date   Allergy    as a child   Hypertension     Past Surgical History:  Procedure Laterality Date   COLONOSCOPY     HYDROCELE EXCISION  01/09/1993   KNEE ARTHROSCOPY     rt x3   KNEE ARTHROSCOPY     x2 on left   POLYPECTOMY     ROTATOR CUFF REPAIR      Family History  Problem Relation Age of Onset   Colon cancer Neg Hx    Diabetes Neg Hx    Esophageal cancer Neg Hx    Rectal cancer Neg Hx    Stomach cancer Neg Hx     Social History   Socioeconomic History   Marital status: Married    Spouse name: Not on file   Number of children: Not on file   Years of education: Not on file   Highest education level: Not on file  Occupational History   Not on file  Tobacco Use   Smoking status: Never   Smokeless tobacco: Never  Vaping Use   Vaping Use: Never used  Substance and Sexual Activity   Alcohol use: Yes    Alcohol/week: 7.0 standard drinks    Types: 7 Standard drinks or equivalent per week    Comment: 4 times a week couple drinks   Drug use: No   Sexual activity: Not on file  Other Topics Concern   Not on file  Social History Narrative   Not on file   Social Determinants of Health   Financial Resource Strain: Not on file  Food  Insecurity: Not on file  Transportation Needs: Not on file  Physical Activity: Not on file  Stress: Not on file  Social Connections: Not on file  Intimate Partner Violence: Not on file    Outpatient Medications Prior to Visit  Medication Sig Dispense Refill   carvedilol (COREG CR) 20 MG 24 hr capsule TAKE 1 CAPSULE(20 MG) BY MOUTH AT BEDTIME 90 capsule 1   eszopiclone (LUNESTA) 1 MG TABS tablet Take 1 tablet (1 mg total) by mouth at bedtime as needed for sleep. Take immediately before bedtime 30 tablet 1   glucosamine-chondroitin 500-400 MG tablet Take 1 tablet by mouth daily.     Multiple Vitamin (MULTIVITAMIN) tablet Take 1 tablet by mouth daily.     PROPECIA 1 MG tablet TAKE 1 TABLET(1 MG) BY MOUTH DAILY 30 tablet 3   TURMERIC PO Take 1 tablet by mouth daily.     VITAMIN D PO Take 1 tablet by mouth daily.     doxycycline (VIBRA-TABS) 100 MG tablet Take 1 tablet (100 mg total) by mouth 2 (two) times daily. (Patient not taking:  No sig reported) 20 tablet 0   tobramycin (TOBREX) 0.3 % ophthalmic solution Place 2 drops into the right eye every 6 (six) hours. (Patient not taking: No sig reported) 5 mL 0   No facility-administered medications prior to visit.    No Known Allergies  ROS Review of Systems  Constitutional: Negative.   HENT:  Positive for congestion, postnasal drip and sneezing.   Eyes:  Positive for itching. Negative for photophobia and visual disturbance.  Respiratory:  Negative for cough and wheezing.   Cardiovascular: Negative.   Gastrointestinal: Negative.   Genitourinary: Negative.   Neurological:  Negative for light-headedness and headaches.     Objective:    Physical Exam Vitals and nursing note reviewed.  Constitutional:      General: He is not in acute distress.    Appearance: Normal appearance. He is not ill-appearing, toxic-appearing or diaphoretic.  HENT:     Head: Normocephalic and atraumatic.     Right Ear: Tympanic membrane, ear canal and  external ear normal.     Left Ear: Tympanic membrane, ear canal and external ear normal.     Mouth/Throat:     Mouth: Mucous membranes are moist.     Pharynx: Oropharynx is clear. No oropharyngeal exudate or posterior oropharyngeal erythema.  Eyes:     Extraocular Movements: Extraocular movements intact.     Conjunctiva/sclera: Conjunctivae normal.     Pupils: Pupils are equal, round, and reactive to light.  Neck:     Vascular: No carotid bruit.  Cardiovascular:     Rate and Rhythm: Normal rate and regular rhythm.  Pulmonary:     Effort: Pulmonary effort is normal.     Breath sounds: Normal breath sounds.  Abdominal:     General: Bowel sounds are normal.  Musculoskeletal:     Cervical back: No rigidity or tenderness.  Lymphadenopathy:     Cervical: No cervical adenopathy.  Skin:    General: Skin is warm and dry.  Neurological:     Mental Status: He is alert and oriented to person, place, and time.  Psychiatric:        Mood and Affect: Mood normal.        Behavior: Behavior normal.    BP (!) 148/76 (BP Location: Left Arm, Patient Position: Sitting, Cuff Size: Normal)   Pulse 72   Temp (!) 97.4 F (36.3 C) (Temporal)   Ht '6\' 1"'$  (1.854 m)   Wt 220 lb 6.4 oz (100 kg)   SpO2 96%   BMI 29.08 kg/m  Wt Readings from Last 3 Encounters:  12/22/20 220 lb 6.4 oz (100 kg)  06/16/20 221 lb 3.2 oz (100.3 kg)  06/08/19 226 lb 11.2 oz (102.8 kg)     Health Maintenance Due  Topic Date Due   Zoster Vaccines- Shingrix (1 of 2) Never done   COVID-19 Vaccine (4 - Booster for Pfizer series) 06/18/2020   INFLUENZA VACCINE  11/03/2020    There are no preventive care reminders to display for this patient.  No results found for: TSH Lab Results  Component Value Date   WBC 6.4 06/16/2020   HGB 13.9 06/16/2020   HCT 39.9 06/16/2020   MCV 94.5 06/16/2020   PLT 284.0 06/16/2020   Lab Results  Component Value Date   NA 140 06/16/2020   K 4.5 06/16/2020   CO2 28 06/16/2020    GLUCOSE 103 (H) 06/16/2020   BUN 11 06/16/2020   CREATININE 0.68 06/16/2020   BILITOT 1.2 06/16/2020  ALKPHOS 62 06/16/2020   AST 17 06/16/2020   ALT 19 06/16/2020   PROT 6.8 06/16/2020   ALBUMIN 4.3 06/16/2020   CALCIUM 9.6 06/16/2020   ANIONGAP 10 08/20/2018   GFR 98.92 06/16/2020   Lab Results  Component Value Date   CHOL 179 06/16/2020   Lab Results  Component Value Date   HDL 59.10 06/16/2020   Lab Results  Component Value Date   LDLCALC 96 06/16/2020   Lab Results  Component Value Date   TRIG 120.0 06/16/2020   Lab Results  Component Value Date   CHOLHDL 3 06/16/2020   No results found for: HGBA1C    Assessment & Plan:   Problem List Items Addressed This Visit       Cardiovascular and Mediastinum   Essential hypertension - Primary     Respiratory   Seasonal allergic rhinitis due to pollen     Other   Adjustment insomnia   Healthcare maintenance   Relevant Orders   Flu Vaccine QUAD 6+ mos PF IM (Fluarix Quad PF)    No orders of the defined types were placed in this encounter.   Follow-up: Return in about 6 months (around 06/21/2021), or Check and record blood pressures. Bring cuff..  Information was given on managing hypertension and allergic rhinitis.  Suggested that he try nasal steroids.  Reminded him that it may take a week or 2 for them to becoming.  Libby Maw, MD

## 2021-01-05 ENCOUNTER — Telehealth: Payer: Self-pay | Admitting: Family Medicine

## 2021-01-05 DIAGNOSIS — I10 Essential (primary) hypertension: Secondary | ICD-10-CM

## 2021-01-05 MED ORDER — CARVEDILOL PHOSPHATE ER 20 MG PO CP24: ORAL_CAPSULE | ORAL | 1 refills | Status: DC

## 2021-01-05 NOTE — Telephone Encounter (Signed)
What is the name of the medication? carvedilol (COREG CR) 20 MG 24 hr capsule [[id number]]  Have you contacted your pharmacy to request a refill? Pt is wanting a refill on this script. Note to Pharmacy: ZERO refills remain on this prescription. Your patient is requesting advance approval of refills for this medication to [place] would you like this sent to? [id number] [address], [address] AT [facility] [address]  [address] [place] [address]  Phone:  [phone]  Fax:  [phone]  DEA #:  [id number]   Patient notified that their request is being sent to the clinical staff for review and that they should receive a call once it is complete. If they do not receive a call within 72 hours they can check with their pharmacy or our office.

## 2021-01-05 NOTE — Telephone Encounter (Signed)
Requested refill sent in  

## 2021-02-06 ENCOUNTER — Other Ambulatory Visit: Payer: Self-pay | Admitting: Family Medicine

## 2021-02-06 DIAGNOSIS — B001 Herpesviral vesicular dermatitis: Secondary | ICD-10-CM

## 2021-05-11 ENCOUNTER — Telehealth: Payer: Self-pay | Admitting: Family Medicine

## 2021-05-11 DIAGNOSIS — I10 Essential (primary) hypertension: Secondary | ICD-10-CM

## 2021-05-11 DIAGNOSIS — L659 Nonscarring hair loss, unspecified: Secondary | ICD-10-CM

## 2021-05-11 MED ORDER — CARVEDILOL PHOSPHATE ER 20 MG PO CP24: ORAL_CAPSULE | ORAL | 1 refills | Status: DC

## 2021-05-11 MED ORDER — PROPECIA 1 MG PO TABS: ORAL_TABLET | ORAL | 3 refills | Status: DC

## 2021-05-11 NOTE — Addendum Note (Signed)
Addended by: Lynda Rainwater on: 05/11/2021 11:13 AM   Modules accepted: Orders

## 2021-05-11 NOTE — Telephone Encounter (Signed)
Pt called and said that he has been trying to get his meds filled through [facility] and he said they said they contacted [place] but havent heard anything back so they told him to call. Its for:  carvedilol (COREG CR) 20 MG 24 hr capsule PROPECIA 1 MG tablet   [facility]  ERQSX:[phone]

## 2021-05-11 NOTE — Telephone Encounter (Signed)
Rx sent in patient aware 

## 2021-06-22 ENCOUNTER — Ambulatory Visit: Payer: 59 | Admitting: Family Medicine

## 2021-09-11 ENCOUNTER — Other Ambulatory Visit: Payer: Self-pay | Admitting: Family Medicine

## 2021-09-11 DIAGNOSIS — I10 Essential (primary) hypertension: Secondary | ICD-10-CM

## 2021-12-03 ENCOUNTER — Encounter: Payer: Self-pay | Admitting: Family Medicine

## 2021-12-03 ENCOUNTER — Ambulatory Visit (INDEPENDENT_AMBULATORY_CARE_PROVIDER_SITE_OTHER): Payer: No Typology Code available for payment source | Admitting: Family Medicine

## 2021-12-03 VITALS — BP 160/82 | HR 54 | Temp 97.0°F | Ht 73.0 in | Wt 215.2 lb

## 2021-12-03 DIAGNOSIS — I10 Essential (primary) hypertension: Secondary | ICD-10-CM | POA: Diagnosis not present

## 2021-12-03 DIAGNOSIS — B001 Herpesviral vesicular dermatitis: Secondary | ICD-10-CM | POA: Diagnosis not present

## 2021-12-03 DIAGNOSIS — F5102 Adjustment insomnia: Secondary | ICD-10-CM | POA: Diagnosis not present

## 2021-12-03 DIAGNOSIS — N5201 Erectile dysfunction due to arterial insufficiency: Secondary | ICD-10-CM | POA: Insufficient documentation

## 2021-12-03 DIAGNOSIS — R6882 Decreased libido: Secondary | ICD-10-CM | POA: Insufficient documentation

## 2021-12-03 LAB — BASIC METABOLIC PANEL
BUN: 15 mg/dL (ref 6–23)
CO2: 26 mEq/L (ref 19–32)
Calcium: 9.5 mg/dL (ref 8.4–10.5)
Chloride: 104 mEq/L (ref 96–112)
Creatinine, Ser: 0.78 mg/dL (ref 0.40–1.50)
GFR: 93.93 mL/min (ref >60.00)
Glucose, Bld: 103 mg/dL — ABNORMAL HIGH (ref 70–99)
Potassium: 4 mEq/L (ref 3.5–5.1)
Sodium: 139 mEq/L (ref 135–145)

## 2021-12-03 MED ORDER — VALACYCLOVIR HCL 1 G PO TABS: ORAL_TABLET | ORAL | 0 refills | Status: AC

## 2021-12-03 MED ORDER — IRBESARTAN 75 MG PO TABS: 75.0000 mg | ORAL_TABLET | Freq: Every day | ORAL | 1 refills | Status: DC

## 2021-12-03 MED ORDER — SILDENAFIL CITRATE 20 MG PO TABS: ORAL_TABLET | ORAL | 0 refills | Status: AC

## 2021-12-03 MED ORDER — ESZOPICLONE 2 MG PO TABS: 2.0000 mg | ORAL_TABLET | Freq: Every evening | ORAL | 0 refills | Status: DC | PRN

## 2021-12-03 NOTE — Progress Notes (Signed)
Established Patient Office Visit  Subjective   Patient ID: Jacob Rodriguez, male    DOB: May 29, 1955  Age: 66 y.o. MRN: [id number]  Chief Complaint  Patient presents with   Follow-up    Routine follow up pt needing to be cleared for upcoming right knee replacement. No concerns patient not fasting.     HPI for follow-up of hypertension, decreased libido and insomnia.  Starting dose of Lunesta was not that helpful.  He has been taking Advil nighttime.  This works sometimes.  Has been noticing a falloff in libido and some ETD.  Wife had a double mastectomy back in April of this year.  She continues to take Humira for RA.  Continues with Corgard without issue.  Admits to increased sodium in diet.  He has decreased his alcohol intake just recently.  X-ray exercising to tolerance.  He is in need of right knee replacement and needs surgical clearance.    Review of Systems  Constitutional: Negative.   HENT: Negative.    Eyes:  Negative for blurred vision, discharge and redness.  Respiratory: Negative.    Cardiovascular: Negative.  Negative for chest pain.  Gastrointestinal:  Negative for abdominal pain.  Genitourinary: Negative.   Musculoskeletal:  Positive for joint pain. Negative for myalgias.  Skin:  Negative for rash.  Neurological:  Negative for dizziness, tingling, loss of consciousness, weakness and headaches.  Endo/Heme/Allergies:  Negative for polydipsia.      Objective:     BP (!) 162/80 (BP Location: Right Arm, Patient Position: Sitting, Cuff Size: Normal)   Pulse (!) 54   Temp (!) 97 F (36.1 C) (Temporal)   Ht '6\' 1"'$  (1.854 m)   Wt 215 lb 3.2 oz (97.6 kg)   SpO2 99%   BMI 28.39 kg/m  BP Readings from Last 3 Encounters:  12/03/21 (!) 162/80  12/22/20 (!) 148/76  06/16/20 138/78   Wt Readings from Last 3 Encounters:  12/03/21 215 lb 3.2 oz (97.6 kg)  12/22/20 220 lb 6.4 oz (100 kg)  06/16/20 221 lb 3.2 oz (100.3 kg)      Physical Exam Constitutional:       General: He is not in acute distress.    Appearance: Normal appearance. He is not ill-appearing, toxic-appearing or diaphoretic.  HENT:     Head: Normocephalic and atraumatic.     Right Ear: External ear normal.     Left Ear: External ear normal.  Eyes:     General: No scleral icterus.       Right eye: No discharge.        Left eye: No discharge.     Extraocular Movements: Extraocular movements intact.     Conjunctiva/sclera: Conjunctivae normal.  Cardiovascular:     Rate and Rhythm: Normal rate and regular rhythm.  Pulmonary:     Effort: Pulmonary effort is normal. No respiratory distress.     Breath sounds: Normal breath sounds.  Abdominal:     General: Bowel sounds are normal.  Musculoskeletal:     Right knee: Swelling present. No deformity or erythema.  Skin:    General: Skin is warm and dry.  Neurological:     Mental Status: He is alert and oriented to person, place, and time.  Psychiatric:        Mood and Affect: Mood normal.        Behavior: Behavior normal.      No results found for any visits on 12/03/21.    The 10-year ASCVD  risk score (Arnett DK, et al., 2019) is: 17%    Assessment & Plan:   Problem List Items Addressed This Visit       Cardiovascular and Mediastinum   Essential hypertension - Primary   Relevant Medications   sildenafil (REVATIO) 20 MG tablet   irbesartan (AVAPRO) 75 MG tablet   Other Relevant Orders   Basic metabolic panel   Erectile dysfunction due to arterial insufficiency   Relevant Medications   sildenafil (REVATIO) 20 MG tablet   irbesartan (AVAPRO) 75 MG tablet     Digestive   Herpes labialis   Relevant Medications   valACYclovir (VALTREX) 1000 MG tablet     Other   Adjustment insomnia   Relevant Medications   eszopiclone (LUNESTA) 2 MG TABS tablet   Libido, decreased   Relevant Orders   Testosterone Total,Free,Bio, Males-([facility])    Return in about 4 weeks (around 12/31/2021), or check and record bps.  Have added  Avapro 75 mg daily to Corgard.  Patient will be called for mindful of sodium intake and continue to moderate alcohol intake.  Continue exercise as tolerated.  Information was given on managing hypertension as well as Avapro.  Have increased dosage of Lunesta to 2 mg nightly as needed.  Checking testosterone levels for decreased libido.  We will try sildenafil as needed.  Information was given on this medication as well.  Libby Maw, MD

## 2021-12-04 LAB — TESTOSTERONE TOTAL,FREE,BIO, MALES
Albumin: 4.7 g/dL (ref 3.6–5.1)
Sex Hormone Binding: 42 nmol/L (ref 22–77)
Testosterone, Bioavailable: 114.2 ng/dL (ref 110.0–575.0)
Testosterone, Free: 53.3 pg/mL (ref 46.0–224.0)
Testosterone: 492 ng/dL (ref 250–827)

## 2021-12-05 ENCOUNTER — Other Ambulatory Visit: Payer: Self-pay | Admitting: Family Medicine

## 2021-12-05 DIAGNOSIS — N5201 Erectile dysfunction due to arterial insufficiency: Secondary | ICD-10-CM

## 2021-12-07 ENCOUNTER — Other Ambulatory Visit: Payer: Self-pay | Admitting: Family Medicine

## 2021-12-07 DIAGNOSIS — N5201 Erectile dysfunction due to arterial insufficiency: Secondary | ICD-10-CM

## 2021-12-30 ENCOUNTER — Encounter: Payer: Self-pay | Admitting: Family Medicine

## 2021-12-30 ENCOUNTER — Ambulatory Visit (INDEPENDENT_AMBULATORY_CARE_PROVIDER_SITE_OTHER): Payer: No Typology Code available for payment source | Admitting: Family Medicine

## 2021-12-30 VITALS — BP 138/70 | HR 65 | Temp 97.1°F | Ht 73.0 in | Wt 217.6 lb

## 2021-12-30 DIAGNOSIS — Z0000 Encounter for general adult medical examination without abnormal findings: Secondary | ICD-10-CM

## 2021-12-30 DIAGNOSIS — I10 Essential (primary) hypertension: Secondary | ICD-10-CM | POA: Diagnosis not present

## 2021-12-30 DIAGNOSIS — F5102 Adjustment insomnia: Secondary | ICD-10-CM

## 2021-12-30 DIAGNOSIS — Z23 Encounter for immunization: Secondary | ICD-10-CM | POA: Diagnosis not present

## 2021-12-30 MED ORDER — IRBESARTAN 300 MG PO TABS: 300.0000 mg | ORAL_TABLET | Freq: Every day | ORAL | 1 refills | Status: DC

## 2021-12-30 MED ORDER — ESZOPICLONE 2 MG PO TABS: 2.0000 mg | ORAL_TABLET | Freq: Every evening | ORAL | 0 refills | Status: DC | PRN

## 2021-12-30 NOTE — Progress Notes (Signed)
Established Patient Office Visit  Subjective   Patient ID: Jacob Rodriguez, male    DOB: 1955-12-24  Age: 66 y.o. MRN: [id number]  Chief Complaint  Patient presents with   Follow-up    Follow up on elevated BP, no concerns. Needing to be cleared for surgery.     HPI for follow-up of blood pressure status post starting Avapro and 150 mg and continuing with carvedilol.  Blood pressure has improved.  He still has frequent excursions up into the 140s.  Feels better taking medication.  [name] has been helping a great deal with sleep when he needs it.  He needs a right knee replacement Like to go ahead and get that scheduled.  He does have regular dental care he is exercising regularly.    Review of Systems  Constitutional: Negative.   HENT: Negative.    Eyes:  Negative for blurred vision, discharge and redness.  Respiratory: Negative.    Cardiovascular: Negative.   Gastrointestinal:  Negative for abdominal pain.  Genitourinary: Negative.   Musculoskeletal:  Positive for joint pain. Negative for myalgias.  Skin:  Negative for rash.  Neurological:  Negative for dizziness, tingling, loss of consciousness, weakness and headaches.  Endo/Heme/Allergies:  Negative for polydipsia.         12/30/2021    9:41 AM 12/03/2021   10:34 AM 06/16/2020    8:12 AM  Depression screen PHQ 2/9  Decreased Interest 0 0 0  Down, Depressed, Hopeless 0 0 0  PHQ - 2 Score 0 0 0     Objective:     BP 138/70 (BP Location: Left Arm, Patient Position: Sitting, Cuff Size: Large)   Pulse 65   Temp (!) 97.1 F (36.2 C) (Temporal)   Ht '6\' 1"'$  (1.854 m)   Wt 217 lb 9.6 oz (98.7 kg)   SpO2 98%   BMI 28.71 kg/m  BP Readings from Last 3 Encounters:  12/30/21 138/70  12/03/21 (!) 160/82  12/22/20 (!) 148/76   Wt Readings from Last 3 Encounters:  12/30/21 217 lb 9.6 oz (98.7 kg)  12/03/21 215 lb 3.2 oz (97.6 kg)  12/22/20 220 lb 6.4 oz (100 kg)      Physical Exam Constitutional:      General: He  is not in acute distress.    Appearance: Normal appearance. He is not ill-appearing, toxic-appearing or diaphoretic.  HENT:     Head: Normocephalic and atraumatic.     Right Ear: External ear normal.     Left Ear: External ear normal.     Mouth/Throat:     Mouth: Mucous membranes are moist.     Pharynx: Oropharynx is clear. No oropharyngeal exudate or posterior oropharyngeal erythema.  Eyes:     General: No scleral icterus.       Right eye: No discharge.        Left eye: No discharge.     Extraocular Movements: Extraocular movements intact.     Conjunctiva/sclera: Conjunctivae normal.     Pupils: Pupils are equal, round, and reactive to light.  Cardiovascular:     Rate and Rhythm: Normal rate and regular rhythm.  Pulmonary:     Effort: Pulmonary effort is normal. No respiratory distress.     Breath sounds: Normal breath sounds.  Abdominal:     General: Bowel sounds are normal.     Tenderness: There is no abdominal tenderness. There is no guarding.  Genitourinary:    Prostate: Enlarged. Not tender and no nodules present.  Rectum: Guaiac result negative. No mass, tenderness, anal fissure, external hemorrhoid or internal hemorrhoid. Normal anal tone.  Musculoskeletal:     Cervical back: No rigidity or tenderness.     Right knee: Swelling present.  Skin:    General: Skin is warm and dry.  Neurological:     Mental Status: He is alert and oriented to person, place, and time.  Psychiatric:        Mood and Affect: Mood normal.        Behavior: Behavior normal.      No results found for any visits on 12/30/21.    The 10-year ASCVD risk score (Arnett DK, et al., 2019) is: 14.1%    Assessment & Plan:   Problem List Items Addressed This Visit       Cardiovascular and Mediastinum   Essential hypertension - Primary   Relevant Medications   irbesartan (AVAPRO) 300 MG tablet     Other   Adjustment insomnia   Relevant Medications   eszopiclone (LUNESTA) 2 MG TABS tablet    Need for influenza vaccination   Relevant Orders   Flu vaccine HIGH DOSE PF (Fluzone High dose)   Healthcare maintenance    Return in about 3 weeks (around 01/20/2022).   Have increased Avapro to 300 mg daily.  Expecting a 5-10 point drop in his blood pressure.  Continue with carvedilol.  Continue Lunesta as needed.  Will be fasting for follow-up for routine blood work.  We will go ahead and improved for surgery.  If Libby Maw, MD

## 2022-01-08 ENCOUNTER — Encounter: Payer: Self-pay | Admitting: Family Medicine

## 2022-01-22 ENCOUNTER — Encounter: Payer: Self-pay | Admitting: Family Medicine

## 2022-01-22 ENCOUNTER — Ambulatory Visit (INDEPENDENT_AMBULATORY_CARE_PROVIDER_SITE_OTHER): Payer: No Typology Code available for payment source | Admitting: Family Medicine

## 2022-01-22 VITALS — BP 156/84 | HR 53 | Temp 97.3°F | Ht 73.0 in | Wt 219.2 lb

## 2022-01-22 DIAGNOSIS — Z0000 Encounter for general adult medical examination without abnormal findings: Secondary | ICD-10-CM

## 2022-01-22 DIAGNOSIS — I10 Essential (primary) hypertension: Secondary | ICD-10-CM | POA: Diagnosis not present

## 2022-01-22 DIAGNOSIS — F5102 Adjustment insomnia: Secondary | ICD-10-CM | POA: Diagnosis not present

## 2022-01-22 LAB — URINALYSIS, ROUTINE W REFLEX MICROSCOPIC
Bilirubin Urine: NEGATIVE
Hgb urine dipstick: NEGATIVE
Ketones, ur: NEGATIVE
Leukocytes,Ua: NEGATIVE
Nitrite: NEGATIVE
RBC / HPF: NONE SEEN (ref 0–?)
Specific Gravity, Urine: <=1.005 — AB (ref 1.000–1.030)
Total Protein, Urine: NEGATIVE
Urine Glucose: NEGATIVE
Urobilinogen, UA: 0.2 (ref 0.0–1.0)
WBC, UA: NONE SEEN (ref 0–?)
pH: 6.5 (ref 5.0–8.0)

## 2022-01-22 LAB — LIPID PANEL
Cholesterol: 199 mg/dL (ref 0–200)
HDL: 65.4 mg/dL (ref >39.00)
LDL Cholesterol: 112 mg/dL — ABNORMAL HIGH (ref 0–99)
NonHDL: 133.85
Total CHOL/HDL Ratio: 3
Triglycerides: 107 mg/dL (ref 0.0–149.0)
VLDL: 21.4 mg/dL (ref 0.0–40.0)

## 2022-01-22 LAB — CBC WITH DIFFERENTIAL/PLATELET
Basophils Absolute: 0 10*3/uL (ref 0.0–0.1)
Basophils Relative: 0.5 % (ref 0.0–3.0)
Eosinophils Absolute: 0.1 10*3/uL (ref 0.0–0.7)
Eosinophils Relative: 1.4 % (ref 0.0–5.0)
HCT: 40.1 % (ref 39.0–52.0)
Hemoglobin: 13.5 g/dL (ref 13.0–17.0)
Lymphocytes Relative: 29.4 % (ref 12.0–46.0)
Lymphs Abs: 1.8 10*3/uL (ref 0.7–4.0)
MCHC: 33.7 g/dL (ref 30.0–36.0)
MCV: 96.3 fl (ref 78.0–100.0)
Monocytes Absolute: 0.5 10*3/uL (ref 0.1–1.0)
Monocytes Relative: 8.7 % (ref 3.0–12.0)
Neutro Abs: 3.6 10*3/uL (ref 1.4–7.7)
Neutrophils Relative %: 60 % (ref 43.0–77.0)
Platelets: 256 10*3/uL (ref 150.0–400.0)
RBC: 4.17 Mil/uL — ABNORMAL LOW (ref 4.22–5.81)
RDW: 12.8 % (ref 11.5–15.5)
WBC: 6 10*3/uL (ref 4.0–10.5)

## 2022-01-22 LAB — PSA: PSA: 0.97 ng/mL (ref 0.10–4.00)

## 2022-01-22 MED ORDER — ESZOPICLONE 2 MG PO TABS: 2.0000 mg | ORAL_TABLET | Freq: Every evening | ORAL | 0 refills | Status: DC | PRN

## 2022-01-22 NOTE — Patient Instructions (Signed)
DUE TO COVID-19 ONLY TWO VISITORS  (aged 65 and older)  ARE ALLOWED TO COME WITH YOU AND STAY IN THE WAITING ROOM ONLY DURING PRE OP AND PROCEDURE.   **NO VISITORS ARE ALLOWED IN THE SHORT STAY AREA OR RECOVERY ROOM!!**  IF YOU WILL BE ADMITTED INTO THE HOSPITAL YOU ARE ALLOWED ONLY FOUR SUPPORT PEOPLE DURING VISITATION HOURS ONLY (7 AM -8PM)   The support person(s) must pass our screening, gel in and out, and wear a mask at all times, including in the patient's room. Patients must also wear a mask when staff or their support person are in the room. Visitors GUEST BADGE MUST BE WORN VISIBLY  One adult visitor may remain with you overnight and MUST be in the room by 8 P.M.     Your procedure is scheduled on: 02/05/22   Report to [facility] Main Entrance    Report to admitting at  9:45 AM   Call this number if you have problems the morning of surgery [phone]   Do not eat food :After Midnight.   After Midnight you may have the following liquids until 9:15______ AM/  DAY OF SURGERY  Water Black Coffee (sugar ok, NO MILK/CREAM OR CREAMERS)  Tea (sugar ok, NO MILK/CREAM OR CREAMERS) regular and decaf                             Plain Jell-O (NO RED)                                           Fruit ices (not with fruit pulp, NO RED)                                     Popsicles (NO RED)                                                                  Juice: apple, WHITE grape, WHITE cranberry Sports drinks like Gatorade (NO RED)      The day of surgery:  Drink ONE (1) Pre-Surgery Clear Ensure  at 9:00 AM the morning of surgery. Drink in one sitting. Do not sip.  This drink was given to you during your hospital  pre-op appointment visit. Nothing else to drink after completing the  Pre-Surgery Clear Ensure at 9:15 AM          If you have questions, please contact your surgeon's office.       Oral Hygiene is also important to reduce your risk of infection.                                     Remember - BRUSH YOUR TEETH THE MORNING OF SURGERY WITH YOUR REGULAR TOOTHPASTE   Do NOT smoke after Midnight   Take these medicines the morning of surgery with A SIP OF WATER: Carvedilol-Coreg  You may not have any metal on your body including  jewelry, and body piercing             Do not wear  lotions, powders, perfumes/cologne, or deodorant                Men may shave face and neck.   Do not bring valuables to the hospital. [place].   Contacts, dentures or bridgework may not be worn into surgery.   Bring small overnight bag day of surgery.   DO NOT [place].    Patients discharged on the day of surgery will not be allowed to drive home.  Someone NEEDS to stay with you for the first 24 hours after anesthesia.   Special Instructions: Bring a copy of your healthcare power of attorney and living will documents  the day of surgery if you haven't scanned them before.              Please read over the following fact sheets you were given: IF YOU HAVE QUESTIONS ABOUT YOUR PRE-OP INSTRUCTIONS PLEASE CALL [phone]     [facility] - Preparing for Surgery Before surgery, you can play an important role.  Because skin is not sterile, your skin needs to be as free of germs as possible.  You can reduce the number of germs on your skin by washing with CHG (chlorahexidine gluconate) soap before surgery.  CHG is an antiseptic cleaner which kills germs and bonds with the skin to continue killing germs even after washing. Please DO NOT use if you have an allergy to CHG or antibacterial soaps.  If your skin becomes reddened/irritated stop using the CHG and inform your nurse when you arrive at Short Stay.  You may shave your face/neck. Please follow these instructions carefully:  1.  Shower with CHG Soap the night before surgery and the  morning of  Surgery.  2.  If you choose to wash your hair, wash your hair first as usual with your  normal  shampoo.  3.  After you shampoo, rinse your hair and body thoroughly to remove the  shampoo.                            4.  Use CHG as you would any other liquid soap.  You can apply chg directly  to the skin and wash                       Gently with a scrungie or clean washcloth.  5.  Apply the CHG Soap to your body ONLY FROM THE NECK DOWN.   Do not use on face/ open                           Wound or open sores. Avoid contact with eyes, ears mouth and genitals (private parts).                       Wash face,  Genitals (private parts) with your normal soap.             6.  Wash thoroughly, paying special attention to the area where your surgery  will be performed.  7.  Thoroughly rinse your body  with warm water from the neck down.  8.  DO NOT shower/wash with your normal soap after using and rinsing off  the CHG Soap.                9.  Pat yourself dry with a clean towel.            10.  Wear clean pajamas.            11.  Place clean sheets on your bed the night of your first shower and do not  sleep with pets. Day of Surgery : Do not apply any lotions/deodorants the morning of surgery.  Please wear clean clothes to the hospital/surgery center.  FAILURE TO FOLLOW THESE INSTRUCTIONS MAY RESULT IN THE CANCELLATION OF YOUR SURGERY   ________________________________________________________________________   Incentive Spirometer  An incentive spirometer is a tool that can help keep your lungs clear and active. This tool measures how well you are filling your lungs with each breath. Taking long deep breaths may help reverse or decrease the chance of developing breathing (pulmonary) problems (especially infection) following: A long period of time when you are unable to move or be active. BEFORE THE PROCEDURE  If the spirometer includes an indicator to show your best effort, your nurse or  respiratory therapist will set it to a desired goal. If possible, sit up straight or lean slightly forward. Try not to slouch. Hold the incentive spirometer in an upright position. INSTRUCTIONS FOR USE  Sit on the edge of your bed if possible, or sit up as far as you can in bed or on a chair. Hold the incentive spirometer in an upright position. Breathe out normally. Place the mouthpiece in your mouth and seal your lips tightly around it. Breathe in slowly and as deeply as possible, raising the piston or the ball toward the top of the column. Hold your breath for 3-5 seconds or for as long as possible. Allow the piston or ball to fall to the bottom of the column. Remove the mouthpiece from your mouth and breathe out normally. Rest for a few seconds and repeat Steps 1 through 7 at least 10 times every 1-2 hours when you are awake. Take your time and take a few normal breaths between deep breaths. The spirometer may include an indicator to show your best effort. Use the indicator as a goal to work toward during each repetition. After each set of 10 deep breaths, practice coughing to be sure your lungs are clear. If you have an incision (the cut made at the time of surgery), support your incision when coughing by placing a pillow or rolled up towels firmly against it. Once you are able to get out of bed, walk around indoors and cough well. You may stop using the incentive spirometer when instructed by your caregiver.  RISKS AND COMPLICATIONS Take your time so you do not get dizzy or light-headed. If you are in pain, you may need to take or ask for pain medication before doing incentive spirometry. It is harder to take a deep breath if you are having pain. AFTER USE Rest and breathe slowly and easily. It can be helpful to keep track of a log of your progress. Your caregiver can provide you with a simple table to help with this. If you are using the spirometer at home, follow these  instructions: [place] IF:  You are having difficultly using the spirometer. You have trouble using the spirometer as often  as instructed. Your pain medication is not giving enough relief while using the spirometer. You develop fever of 100.5 F (38.1 C) or higher. SEEK IMMEDIATE MEDICAL CARE IF:  You cough up bloody sputum that had not been present before. You develop fever of 102 F (38.9 C) or greater. You develop worsening pain at or near the incision site. MAKE SURE YOU:  Understand these instructions. Will watch your condition. Will get help right away if you are not doing well or get worse. Document Released: 08/02/2006 Document Revised: 06/14/2011 Document Reviewed: 10/03/2006 [facility] Patient Information 2014 [place], [place].   ________________________________________________________________________

## 2022-01-22 NOTE — Progress Notes (Signed)
Established Patient Office Visit  Subjective   Patient ID: Jacob Rodriguez, male    DOB: Aug 01, 1955  Age: 66 y.o. MRN: [id number]  Chief Complaint  Patient presents with   Annual Exam    CPE, no concerns. Patient fasting.     HPI for follow-up of hypertension status post increasing Avapro to 300 mg, insomnia, and fasting labs.  Doing well with the Avapro 300 mg and continues with carvedilol.  BP at home per his cuff runs in the 130s over 70s.  Knee replacement surgery is on the third.    Review of Systems  Constitutional: Negative.   HENT: Negative.    Eyes:  Negative for blurred vision, discharge and redness.  Respiratory: Negative.    Cardiovascular: Negative.   Gastrointestinal:  Negative for abdominal pain.  Genitourinary: Negative.   Musculoskeletal: Negative.  Negative for myalgias.  Skin:  Negative for rash.  Neurological:  Negative for tingling, loss of consciousness and weakness.  Endo/Heme/Allergies:  Negative for polydipsia.      Objective:     BP (!) 148/78 (BP Location: Right Arm, Patient Position: Sitting, Cuff Size: Normal)   Pulse (!) 53   Temp (!) 97.3 F (36.3 C) (Temporal)   Ht '6\' 1"'$  (1.854 m)   Wt 219 lb 3.2 oz (99.4 kg)   SpO2 99%   BMI 28.92 kg/m    Physical Exam Constitutional:      General: He is not in acute distress.    Appearance: Normal appearance. He is not ill-appearing, toxic-appearing or diaphoretic.  HENT:     Head: Normocephalic and atraumatic.     Right Ear: External ear normal.     Left Ear: External ear normal.  Eyes:     General: No scleral icterus.       Right eye: No discharge.        Left eye: No discharge.     Extraocular Movements: Extraocular movements intact.     Conjunctiva/sclera: Conjunctivae normal.  Cardiovascular:     Rate and Rhythm: Normal rate and regular rhythm.  Pulmonary:     Effort: Pulmonary effort is normal. No respiratory distress.     Breath sounds: Normal breath sounds.  Skin:    General:  Skin is warm and dry.  Neurological:     Mental Status: He is alert and oriented to person, place, and time.  Psychiatric:        Mood and Affect: Mood normal.        Behavior: Behavior normal.      No results found for any visits on 01/22/22.    The 10-year ASCVD risk score (Arnett DK, et al., 2019) is: 15.9%    Assessment & Plan:   Problem List Items Addressed This Visit       Cardiovascular and Mediastinum   White coat syndrome with diagnosis of hypertension - Primary     Other   Adjustment insomnia   Relevant Medications   eszopiclone (LUNESTA) 2 MG TABS tablet   Healthcare maintenance   Relevant Orders   CBC   CBC with Differential/Platelet   Lipid panel   PSA   Urinalysis, Routine w reflex microscopic    Return in about 4 weeks (around 02/19/2022).   Information given on managing hypertension.  Check and record blood pressures.  Follow-up in 1 month and bring your cuff with you.  Blood pressure is actually higher today with higher dose of Avapro.  Would have to consider whitecoat component. Mortimer Fries  Ethelene Hal, MD

## 2022-01-25 NOTE — H&P (Signed)
KNEE ARTHROPLASTY ADMISSION H&P  Patient ID: Jacob Rodriguez MRN: [id number] DOB/AGE: 1955-12-05 66 y.o.  Chief Complaint: right knee pain.  Planned Procedure Date: 02/05/22 Medical Clearance by Dr. Ethelene Hal    HPI: Jacob Rodriguez is a 66 y.o. male who presents for evaluation of OA RIGHT KNEE. The patient has a history of pain and functional disability in the right knee due to arthritis and has failed non-surgical conservative treatments for greater than 12 weeks to include NSAID's and/or analgesics and activity modification.  Onset of symptoms was gradual, starting 5 years ago with gradually worsening course since that time. The patient noted prior procedures on the knee to include  arthroscopy and menisectomy on the right knee.  Patient currently rates pain at 5 out of 10 with activity. Patient has worsening of pain with activity and weight bearing and pain that interferes with activities of daily living.  Patient has evidence of joint space narrowing by imaging studies.  There is no active infection.  Past Medical History:  Diagnosis Date   Allergy    as a child   Hypertension    Past Surgical History:  Procedure Laterality Date   COLONOSCOPY     HYDROCELE EXCISION  01/09/1993   KNEE ARTHROSCOPY     rt x3   KNEE ARTHROSCOPY     x2 on left   POLYPECTOMY     ROTATOR CUFF REPAIR     No Known Allergies Prior to Admission medications   Medication Sig Start Date End Date Taking? Authorizing Provider  carvedilol (COREG CR) 20 MG 24 hr capsule TAKE 1 CAPSULE AT BEDTIME 09/11/21  Yes Libby Maw, MD  fluticasone (CUTIVATE) 0.05 % cream Apply 1 Application topically 2 (two) times daily as needed (irritation).   Yes [provider]  glucosamine-chondroitin 500-400 MG tablet Take 1 tablet by mouth daily.   Yes [provider]  irbesartan (AVAPRO) 300 MG tablet Take 1 tablet (300 mg total) by mouth daily. 12/30/21  Yes Libby Maw, MD  Multiple Vitamin  (MULTIVITAMIN) tablet Take 1 tablet by mouth daily.   Yes [provider]  PROPECIA 1 MG tablet TAKE 1 TABLET(1 MG) BY MOUTH DAILY Patient taking differently: Take 0.25 mg by mouth daily. 05/11/21  Yes Libby Maw, MD  TURMERIC PO Take 1 tablet by mouth daily.   Yes [provider]  valACYclovir (VALTREX) 1000 MG tablet TAKE 1/2 TABLET(500 MG) BY MOUTH TWICE DAILY FOR 3 DAYS AS NEEDED FOR OUTBREAK 12/03/21  Yes Libby Maw, MD  VITAMIN D PO Take 1 tablet by mouth daily.   Yes [provider]  eszopiclone (LUNESTA) 2 MG TABS tablet Take 1 tablet (2 mg total) by mouth at bedtime as needed for sleep. Take immediately before bedtime 01/22/22   Libby Maw, MD  sildenafil (REVATIO) 20 MG tablet May take 3 to 5 tablets 45 minutes prior with no more than 5 tablets daily. 12/03/21   Libby Maw, MD   Social History   Socioeconomic History   Marital status: Married    Spouse name: Not on file   Number of children: Not on file   Years of education: Not on file   Highest education level: Not on file  Occupational History   Not on file  Tobacco Use   Smoking status: Never   Smokeless tobacco: Never  Vaping Use   Vaping Use: Never used  Substance and Sexual Activity   Alcohol use: Yes  Alcohol/week: 7.0 standard drinks of alcohol    Types: 7 Standard drinks or equivalent per week    Comment: 4 times a week couple drinks   Drug use: No   Sexual activity: Yes  Other Topics Concern   Not on file  Social History Narrative   Not on file   Social Determinants of Health   Financial Resource Strain: Not on file  Food Insecurity: Not on file  Transportation Needs: Not on file  Physical Activity: Not on file  Stress: Not on file  Social Connections: Not on file   Family History  Problem Relation Age of Onset   Colon cancer Neg Hx    Diabetes Neg Hx    Esophageal cancer Neg Hx    Rectal cancer Neg Hx    Stomach cancer Neg  Hx     ROS: Currently denies lightheadedness, dizziness, Fever, chills, CP, SOB.   No personal history of DVT, PE, MI, or CVA. No loose teeth or dentures All other systems have been reviewed and were otherwise currently negative with the exception of those mentioned in the HPI and as above.  Objective: Vitals: Ht: '6\' 1"'$  Wt: 220 lbs Temp: 97.7 BP: 148/77 Pulse: 66 O2 99% on room air.   Physical Exam: General: Alert, NAD.  Antalgic Gait  HEENT: EOMI, Good Neck Extension  Pulm: No increased work of breathing.  Clear B/L A/P w/o crackle or wheeze.  CV: RRR, No m/g/r appreciated  GI: soft, NT, ND Neuro: Neuro without gross focal deficit.  Sensation intact distally Skin: No lesions in the area of chief complaint MSK/Surgical Site: right knee w/o redness or effusion.  + medial JLT. ROM  0-110.  5/5 strength in extension and flexion.  +EHL/FHL.  NVI.  Stable varus and valgus stress.    Imaging Review Plain radiographs demonstrate severe degenerative joint disease of the right knee.   The overall alignment is valgus. The bone quality appears to be adequate for age and reported activity level.  Preoperative templating of the joint replacement has been completed, documented, and submitted to the Operating Room personnel in order to optimize intra-operative equipment management.  Assessment: OA RIGHT KNEE   Plan: Plan for Procedure(s): TOTAL KNEE ARTHROPLASTY  The patient history, physical exam, clinical judgement of the provider and imaging are consistent with end stage degenerative joint disease and total joint arthroplasty is deemed medically necessary. The treatment options including medical management, injection therapy, and arthroplasty were discussed at length. The risks and benefits of Procedure(s): TOTAL KNEE ARTHROPLASTY were presented and reviewed.  The risks of nonoperative treatment, versus surgical intervention including but not limited to continued pain, aseptic loosening,  stiffness, dislocation/subluxation, infection, bleeding, nerve injury, blood clots, cardiopulmonary complications, morbidity, mortality, among others were discussed. The patient verbalizes understanding and wishes to proceed with the plan.   Dental prophylaxis discussed and recommended for 2 years postoperatively.  The patient does meet the criteria for TXA which will be used perioperatively.   ASA 81 mg BID will be used postoperatively for DVT prophylaxis in addition to SCDs, and early ambulation. The patient is planning to be discharged home with OPPT in care of his wife Neil Crouch, PA-C 01/25/2022 1:05 PM

## 2022-01-26 ENCOUNTER — Encounter (HOSPITAL_COMMUNITY): Payer: Self-pay

## 2022-01-26 ENCOUNTER — Other Ambulatory Visit: Payer: Self-pay

## 2022-01-26 ENCOUNTER — Encounter (HOSPITAL_COMMUNITY)
Admission: RE | Admit: 2022-01-26 | Discharge: 2022-01-26 | Disposition: A | Discharge status: Home / Self Care | Payer: No Typology Code available for payment source | Source: Ambulatory Visit | Attending: Orthopedic Surgery | Admitting: Orthopedic Surgery

## 2022-01-26 DIAGNOSIS — Z01818 Encounter for other preprocedural examination: Secondary | ICD-10-CM | POA: Insufficient documentation

## 2022-01-26 DIAGNOSIS — I10 Essential (primary) hypertension: Secondary | ICD-10-CM | POA: Diagnosis not present

## 2022-01-26 HISTORY — DX: Unspecified osteoarthritis, unspecified site: M19.90

## 2022-01-26 LAB — SURGICAL PCR SCREEN
MRSA, PCR: NEGATIVE
Staphylococcus aureus: NEGATIVE

## 2022-01-26 LAB — BASIC METABOLIC PANEL
Anion gap: 9 (ref 5–15)
BUN: 12 mg/dL (ref 8–23)
CO2: 24 mmol/L (ref 22–32)
Calcium: 9.4 mg/dL (ref 8.9–10.3)
Chloride: 106 mmol/L (ref 98–111)
Creatinine, Ser: 0.6 mg/dL — ABNORMAL LOW (ref 0.61–1.24)
GFR, Estimated: >60 mL/min (ref >60)
Glucose, Bld: 112 mg/dL — ABNORMAL HIGH (ref 70–99)
Potassium: 4.2 mmol/L (ref 3.5–5.1)
Sodium: 139 mmol/L (ref 135–145)

## 2022-01-26 NOTE — Progress Notes (Addendum)
Anesthesia note:  Bowel prep reminder:NA  PCP - Dr. Lamarr Lulas Cardiologist -none Other-   Chest x-ray - no EKG - 01/26/22-chart Stress Test -no  ECHO - no Cardiac Cath - no  Pacemaker/ICD device last checked:NA  Sleep Study - no CPAP -   Pt is pre diabetic- Fasting Blood Sugar -  Checks Blood Sugar _____  Blood Thinner:NA Blood Thinner Instructions: Aspirin Instructions: Last Dose:  Anesthesia review: no  Patient denies shortness of breath, fever, cough and chest pain at PAT appointment Pt has no SOB with activities. He works out daily.  Patient verbalized understanding of instructions that were given to them at the PAT appointment. Patient was also instructed that they will need to review over the PAT instructions again at home before surgery. Yes  PCR was reported as "invalid "result. They said they had to run it again at 3:16 PM

## 2022-02-04 NOTE — Anesthesia Preprocedure Evaluation (Addendum)
Anesthesia Evaluation  Patient identified by MRN, date of birth, ID band Patient awake    Reviewed: Allergy & Precautions, NPO status , Patient's Chart, lab work & pertinent test results  History of Anesthesia Complications Negative for: history of anesthetic complications  Airway Mallampati: II  TM Distance: >3 FB Neck ROM: Full    Dental  (+) Dental Advisory Given, Teeth Intact   Pulmonary neg pulmonary ROS   breath sounds clear to auscultation       Cardiovascular hypertension, Pt. on medications and Pt. on home beta blockers (-) angina  Rhythm:Regular Rate:Normal     Neuro/Psych negative neurological ROS     GI/Hepatic negative GI ROS, Neg liver ROS,,,  Endo/Other  negative endocrine ROS    Renal/GU negative Renal ROS     Musculoskeletal   Abdominal   Peds  Hematology negative hematology ROS (+)   Anesthesia Other Findings   Reproductive/Obstetrics                             Anesthesia Physical Anesthesia Plan  ASA: 2  Anesthesia Plan: Spinal   Post-op Pain Management: Regional block* and Tylenol PO (pre-op)*   Induction: Intravenous  PONV Risk Score and Plan: 1 and Ondansetron and Treatment may vary due to age or medical condition  Airway Management Planned: Natural Airway and Simple Face Mask  Additional Equipment:   Intra-op Plan:   Post-operative Plan:   Informed Consent: I have reviewed the patients History and Physical, chart, labs and discussed the procedure including the risks, benefits and alternatives for the proposed anesthesia with the patient or authorized representative who has indicated his/her understanding and acceptance.     Dental advisory given  Plan Discussed with: CRNA and Surgeon  Anesthesia Plan Comments: (Plan routine monitors, SAB with adductor canal block for post op analgesia)        Anesthesia Quick Evaluation

## 2022-02-05 ENCOUNTER — Encounter (HOSPITAL_COMMUNITY)
Admission: RE | Disposition: A | Discharge status: Home / Self Care | Payer: Self-pay | Source: Ambulatory Visit | Attending: Orthopedic Surgery

## 2022-02-05 ENCOUNTER — Ambulatory Visit (HOSPITAL_BASED_OUTPATIENT_CLINIC_OR_DEPARTMENT_OTHER): Payer: No Typology Code available for payment source | Admitting: Anesthesiology

## 2022-02-05 ENCOUNTER — Encounter (HOSPITAL_COMMUNITY): Payer: Self-pay | Admitting: Orthopedic Surgery

## 2022-02-05 ENCOUNTER — Ambulatory Visit (HOSPITAL_COMMUNITY): Payer: No Typology Code available for payment source | Admitting: Anesthesiology

## 2022-02-05 ENCOUNTER — Other Ambulatory Visit: Payer: Self-pay

## 2022-02-05 ENCOUNTER — Ambulatory Visit (HOSPITAL_COMMUNITY): Payer: No Typology Code available for payment source

## 2022-02-05 ENCOUNTER — Ambulatory Visit (HOSPITAL_COMMUNITY)
Admission: RE | Admit: 2022-02-05 | Discharge: 2022-02-05 | Disposition: A | Discharge status: Home / Self Care | Payer: No Typology Code available for payment source | Source: Ambulatory Visit | Attending: Orthopedic Surgery | Admitting: Orthopedic Surgery

## 2022-02-05 DIAGNOSIS — M1711 Unilateral primary osteoarthritis, right knee: Secondary | ICD-10-CM | POA: Diagnosis present

## 2022-02-05 DIAGNOSIS — Z01818 Encounter for other preprocedural examination: Secondary | ICD-10-CM

## 2022-02-05 DIAGNOSIS — M25761 Osteophyte, right knee: Secondary | ICD-10-CM | POA: Diagnosis not present

## 2022-02-05 DIAGNOSIS — I10 Essential (primary) hypertension: Secondary | ICD-10-CM | POA: Insufficient documentation

## 2022-02-05 HISTORY — PX: TOTAL KNEE ARTHROPLASTY: SHX125

## 2022-02-05 SURGERY — ARTHROPLASTY, KNEE, TOTAL
Anesthesia: Spinal | Site: Knee | Laterality: Right

## 2022-02-05 MED ORDER — BUPIVACAINE LIPOSOME 1.3 % IJ SUSP
INTRAMUSCULAR | Status: DC | PRN
  Administered 2022-02-05: 20 mL

## 2022-02-05 MED ORDER — POVIDONE-IODINE 10 % EX SWAB: 2.0000 | Freq: Once | CUTANEOUS | Status: DC

## 2022-02-05 MED ORDER — ORAL CARE MOUTH RINSE: 15.0000 mL | Freq: Once | OROMUCOSAL | Status: AC

## 2022-02-05 MED ORDER — ZOLPIDEM TARTRATE ER 6.25 MG PO TBCR: 6.2500 mg | EXTENDED_RELEASE_TABLET | Freq: Every evening | ORAL | 0 refills | Status: DC | PRN

## 2022-02-05 MED ORDER — OXYCODONE HCL 5 MG PO TABS: 5.0000 mg | ORAL_TABLET | ORAL | 0 refills | Status: AC | PRN

## 2022-02-05 MED ORDER — ISOPROPYL ALCOHOL 70 % SOLN
Status: DC | PRN
  Administered 2022-02-05: 1 via TOPICAL

## 2022-02-05 MED ORDER — FENTANYL CITRATE (PF) 100 MCG/2ML IJ SOLN
INTRAMUSCULAR | Status: DC | PRN
  Administered 2022-02-05 (×2): 50 ug via INTRAVENOUS

## 2022-02-05 MED ORDER — DEXAMETHASONE SODIUM PHOSPHATE 10 MG/ML IJ SOLN
INTRAMUSCULAR | Status: DC | PRN
  Administered 2022-02-05: 8 mg via INTRAVENOUS

## 2022-02-05 MED ORDER — SODIUM CHLORIDE 0.9 % IR SOLN
Status: DC | PRN
  Administered 2022-02-05: 3000 mL

## 2022-02-05 MED ORDER — CELECOXIB 200 MG PO CAPS
400.0000 mg | ORAL_CAPSULE | Freq: Once | ORAL | Status: AC
  Administered 2022-02-05: 400 mg via ORAL
  Filled 2022-02-05: qty 2

## 2022-02-05 MED ORDER — ONDANSETRON HCL 4 MG/2ML IJ SOLN
4.0000 mg | Freq: Four times a day (QID) | INTRAMUSCULAR | Status: DC | PRN
  Administered 2022-02-05: 4 mg via INTRAVENOUS

## 2022-02-05 MED ORDER — CEFAZOLIN SODIUM-DEXTROSE 2-4 GM/100ML-% IV SOLN
2.0000 g | Freq: Four times a day (QID) | INTRAVENOUS | Status: DC
  Administered 2022-02-05: 2 g via INTRAVENOUS

## 2022-02-05 MED ORDER — PROPOFOL 500 MG/50ML IV EMUL
INTRAVENOUS | Status: DC | PRN
  Administered 2022-02-05: 125 ug/kg/min via INTRAVENOUS

## 2022-02-05 MED ORDER — POVIDONE-IODINE 10 % EX SWAB
2.0000 | Freq: Once | CUTANEOUS | Status: AC
  Administered 2022-02-05: 2 via TOPICAL

## 2022-02-05 MED ORDER — BUPIVACAINE LIPOSOME 1.3 % IJ SUSP
INTRAMUSCULAR | Status: AC
  Filled 2022-02-05: qty 20

## 2022-02-05 MED ORDER — FENTANYL CITRATE (PF) 100 MCG/2ML IJ SOLN
INTRAMUSCULAR | Status: AC
  Filled 2022-02-05: qty 2

## 2022-02-05 MED ORDER — MIDAZOLAM HCL 2 MG/2ML IJ SOLN
INTRAMUSCULAR | Status: AC
  Filled 2022-02-05: qty 2

## 2022-02-05 MED ORDER — SODIUM CHLORIDE (PF) 0.9 % IJ SOLN
INTRAMUSCULAR | Status: AC
  Filled 2022-02-05: qty 10

## 2022-02-05 MED ORDER — PHENYLEPHRINE HCL (PRESSORS) 10 MG/ML IV SOLN
INTRAVENOUS | Status: AC
  Filled 2022-02-05: qty 1

## 2022-02-05 MED ORDER — MIDAZOLAM HCL 2 MG/2ML IJ SOLN: 0.5000 mg | Freq: Once | INTRAMUSCULAR | Status: DC | PRN

## 2022-02-05 MED ORDER — LIDOCAINE 2% (20 MG/ML) 5 ML SYRINGE
INTRAMUSCULAR | Status: DC | PRN
  Administered 2022-02-05: 40 mg via INTRAVENOUS

## 2022-02-05 MED ORDER — HYDROMORPHONE HCL 1 MG/ML IJ SOLN
0.2500 mg | INTRAMUSCULAR | Status: DC | PRN
  Administered 2022-02-05 (×4): 0.5 mg via INTRAVENOUS

## 2022-02-05 MED ORDER — LACTATED RINGERS IV BOLUS
250.0000 mL | Freq: Once | INTRAVENOUS | Status: AC
  Administered 2022-02-05: 250 mL via INTRAVENOUS

## 2022-02-05 MED ORDER — OXYCODONE HCL 5 MG PO TABS: 5.0000 mg | ORAL_TABLET | Freq: Once | ORAL | Status: DC | PRN

## 2022-02-05 MED ORDER — WATER FOR IRRIGATION, STERILE IR SOLN
Status: DC | PRN
  Administered 2022-02-05: 2000 mL

## 2022-02-05 MED ORDER — ISOPROPYL ALCOHOL 70 % SOLN
Status: AC
  Filled 2022-02-05: qty 480

## 2022-02-05 MED ORDER — PROPOFOL 10 MG/ML IV BOLUS
INTRAVENOUS | Status: DC | PRN
  Administered 2022-02-05 (×7): 20 mg via INTRAVENOUS

## 2022-02-05 MED ORDER — HYDROMORPHONE HCL 1 MG/ML IJ SOLN
INTRAMUSCULAR | Status: AC
  Filled 2022-02-05: qty 1

## 2022-02-05 MED ORDER — MIDAZOLAM HCL 2 MG/2ML IJ SOLN
1.0000 mg | INTRAMUSCULAR | Status: DC
  Administered 2022-02-05: 2 mg via INTRAVENOUS
  Filled 2022-02-05: qty 2

## 2022-02-05 MED ORDER — METHOCARBAMOL 500 MG IVPB - SIMPLE MED
INTRAVENOUS | Status: AC
  Filled 2022-02-05: qty 55

## 2022-02-05 MED ORDER — SODIUM CHLORIDE (PF) 0.9 % IJ SOLN
INTRAMUSCULAR | Status: AC
  Filled 2022-02-05: qty 50

## 2022-02-05 MED ORDER — ROPIVACAINE HCL 7.5 MG/ML IJ SOLN
INTRAMUSCULAR | Status: DC | PRN
  Administered 2022-02-05: 20 mL via PERINEURAL

## 2022-02-05 MED ORDER — METHOCARBAMOL 500 MG PO TABS: 500.0000 mg | ORAL_TABLET | Freq: Four times a day (QID) | ORAL | Status: DC | PRN

## 2022-02-05 MED ORDER — TRANEXAMIC ACID-NACL 1000-0.7 MG/100ML-% IV SOLN
INTRAVENOUS | Status: AC
  Administered 2022-02-05: 1000 mg
  Filled 2022-02-05: qty 100

## 2022-02-05 MED ORDER — ASPIRIN 81 MG PO TBEC: 81.0000 mg | DELAYED_RELEASE_TABLET | Freq: Two times a day (BID) | ORAL | 0 refills | Status: AC

## 2022-02-05 MED ORDER — PHENYLEPHRINE HCL-NACL 20-0.9 MG/250ML-% IV SOLN
INTRAVENOUS | Status: DC | PRN
  Administered 2022-02-05: 25 ug/min via INTRAVENOUS

## 2022-02-05 MED ORDER — MEPERIDINE HCL 50 MG/ML IJ SOLN: 6.2500 mg | INTRAMUSCULAR | Status: DC | PRN

## 2022-02-05 MED ORDER — CHLORHEXIDINE GLUCONATE 0.12 % MT SOLN
15.0000 mL | Freq: Once | OROMUCOSAL | Status: AC
  Administered 2022-02-05: 15 mL via OROMUCOSAL

## 2022-02-05 MED ORDER — TRANEXAMIC ACID-NACL 1000-0.7 MG/100ML-% IV SOLN
1000.0000 mg | INTRAVENOUS | Status: AC
  Administered 2022-02-05: 1000 mg via INTRAVENOUS
  Filled 2022-02-05: qty 100

## 2022-02-05 MED ORDER — METHOCARBAMOL 500 MG IVPB - SIMPLE MED
500.0000 mg | Freq: Four times a day (QID) | INTRAVENOUS | Status: DC | PRN
  Administered 2022-02-05: 500 mg via INTRAVENOUS

## 2022-02-05 MED ORDER — ONDANSETRON HCL 4 MG PO TABS: 4.0000 mg | ORAL_TABLET | Freq: Four times a day (QID) | ORAL | Status: DC | PRN

## 2022-02-05 MED ORDER — ACETAMINOPHEN 500 MG PO TABS
1000.0000 mg | ORAL_TABLET | Freq: Once | ORAL | Status: AC
  Administered 2022-02-05: 1000 mg via ORAL
  Filled 2022-02-05: qty 2

## 2022-02-05 MED ORDER — CEFAZOLIN SODIUM-DEXTROSE 2-4 GM/100ML-% IV SOLN
INTRAVENOUS | Status: AC
  Filled 2022-02-05: qty 100

## 2022-02-05 MED ORDER — CELECOXIB 100 MG PO CAPS: 100.0000 mg | ORAL_CAPSULE | Freq: Two times a day (BID) | ORAL | 0 refills | Status: AC

## 2022-02-05 MED ORDER — ACETAMINOPHEN 500 MG PO TABS: 1000.0000 mg | ORAL_TABLET | Freq: Once | ORAL | Status: DC

## 2022-02-05 MED ORDER — OXYCODONE HCL 5 MG/5ML PO SOLN: 5.0000 mg | Freq: Once | ORAL | Status: DC | PRN

## 2022-02-05 MED ORDER — LACTATED RINGERS IV SOLN: INTRAVENOUS | Status: DC

## 2022-02-05 MED ORDER — POVIDONE-IODINE 7.5 % EX SOLN: Freq: Once | CUTANEOUS | Status: DC

## 2022-02-05 MED ORDER — ONDANSETRON HCL 4 MG PO TABS: 4.0000 mg | ORAL_TABLET | Freq: Three times a day (TID) | ORAL | 0 refills | Status: AC | PRN

## 2022-02-05 MED ORDER — BUPIVACAINE IN DEXTROSE 0.75-8.25 % IT SOLN
INTRATHECAL | Status: DC | PRN
  Administered 2022-02-05: 15 mg via INTRATHECAL

## 2022-02-05 MED ORDER — BUPIVACAINE LIPOSOME 1.3 % IJ SUSP: 20.0000 mL | Freq: Once | INTRAMUSCULAR | Status: DC

## 2022-02-05 MED ORDER — PROMETHAZINE HCL 25 MG/ML IJ SOLN: 6.2500 mg | INTRAMUSCULAR | Status: DC | PRN

## 2022-02-05 MED ORDER — CEFAZOLIN SODIUM-DEXTROSE 2-4 GM/100ML-% IV SOLN
2.0000 g | INTRAVENOUS | Status: AC
  Administered 2022-02-05: 2 g via INTRAVENOUS
  Filled 2022-02-05: qty 100

## 2022-02-05 MED ORDER — 0.9 % SODIUM CHLORIDE (POUR BTL) OPTIME
TOPICAL | Status: DC | PRN
  Administered 2022-02-05: 1000 mL

## 2022-02-05 MED ORDER — FENTANYL CITRATE PF 50 MCG/ML IJ SOSY
50.0000 ug | PREFILLED_SYRINGE | INTRAMUSCULAR | Status: DC
  Administered 2022-02-05 (×2): 50 ug via INTRAVENOUS
  Filled 2022-02-05: qty 2

## 2022-02-05 MED ORDER — ONDANSETRON HCL 4 MG/2ML IJ SOLN
INTRAMUSCULAR | Status: AC
  Filled 2022-02-05: qty 2

## 2022-02-05 MED ORDER — DEXAMETHASONE SODIUM PHOSPHATE 10 MG/ML IJ SOLN: 8.0000 mg | Freq: Once | INTRAMUSCULAR | Status: DC

## 2022-02-05 MED ORDER — LACTATED RINGERS IV SOLN: INTRAVENOUS | Status: DC | PRN

## 2022-02-05 MED ORDER — PROPOFOL 10 MG/ML IV BOLUS
INTRAVENOUS | Status: AC
  Filled 2022-02-05: qty 20

## 2022-02-05 MED ORDER — METHOCARBAMOL 500 MG PO TABS: 500.0000 mg | ORAL_TABLET | Freq: Three times a day (TID) | ORAL | 0 refills | Status: AC | PRN

## 2022-02-05 MED ORDER — LACTATED RINGERS IV BOLUS
500.0000 mL | Freq: Once | INTRAVENOUS | Status: AC
  Administered 2022-02-05: 500 mL via INTRAVENOUS

## 2022-02-05 MED ORDER — SODIUM CHLORIDE 0.9% FLUSH
INTRAVENOUS | Status: DC | PRN
  Administered 2022-02-05: 60 mL

## 2022-02-05 SURGICAL SUPPLY — 59 items
BAG COUNTER SPONGE SURGICOUNT (BAG) IMPLANT
BLADE SAG 18X100X1.27 (BLADE) ×1 IMPLANT
BLADE SAW SAG 35X64 .89 (BLADE) ×1 IMPLANT
BNDG COHESIVE 3X5 TAN ST LF (GAUZE/BANDAGES/DRESSINGS) ×1 IMPLANT
BNDG ELASTIC 6X10 VLCR STRL LF (GAUZE/BANDAGES/DRESSINGS) ×1 IMPLANT
BOWL SMART MIX CTS (DISPOSABLE) ×1 IMPLANT
CEMENT BONE R 1X40 (Cement) IMPLANT
CHLORAPREP W/TINT 26 (MISCELLANEOUS) ×2 IMPLANT
COMP FEM CMT PS STD 11 RT (Joint) ×1 IMPLANT
COMP PATELLAR 10X35 METAL (Joint) ×1 IMPLANT
COMP TIB KNEE PS G 0 RT (Joint) ×1 IMPLANT
COMPONENT FEM CMT PS STD 11 RT (Joint) IMPLANT
COMPONENT PATELLAR 10X35 METAL (Joint) IMPLANT
COMPONENT TIB KNEE PS G 0 RT (Joint) IMPLANT
COVER SURGICAL LIGHT HANDLE (MISCELLANEOUS) ×1 IMPLANT
CUFF TOURN SGL QUICK 34 (TOURNIQUET CUFF) ×1
CUFF TRNQT CYL 34X4.125X (TOURNIQUET CUFF) ×1 IMPLANT
DERMABOND ADVANCED .7 DNX12 (GAUZE/BANDAGES/DRESSINGS) ×1 IMPLANT
DRAPE INCISE IOBAN 85X60 (DRAPES) ×1 IMPLANT
DRAPE SHEET LG 3/4 BI-LAMINATE (DRAPES) ×1 IMPLANT
DRAPE U-SHAPE 47X51 STRL (DRAPES) ×1 IMPLANT
DRESSING AQUACEL AG SP 3.5X10 (GAUZE/BANDAGES/DRESSINGS) ×1 IMPLANT
DRSG AQUACEL AG SP 3.5X10 (GAUZE/BANDAGES/DRESSINGS) ×1
ELECT REM PT RETURN 15FT ADLT (MISCELLANEOUS) ×1 IMPLANT
GAUZE SPONGE 4X4 12PLY STRL (GAUZE/BANDAGES/DRESSINGS) ×1 IMPLANT
GLOVE BIOGEL PI IND STRL 8 (GLOVE) ×1 IMPLANT
GLOVE SURG ORTHO 8.0 STRL STRW (GLOVE) ×2 IMPLANT
GOWN STRL REUS W/ TWL XL LVL3 (GOWN DISPOSABLE) ×1 IMPLANT
GOWN STRL REUS W/TWL XL LVL3 (GOWN DISPOSABLE) ×1
HANDPIECE INTERPULSE COAX TIP (DISPOSABLE) ×1
HDLS TROCR DRIL PIN KNEE 75 (PIN) ×1
HOLDER FOLEY CATH W/STRAP (MISCELLANEOUS) ×1 IMPLANT
HOOD PEEL AWAY T7 (MISCELLANEOUS) ×3 IMPLANT
INSERT TIBIAL PERSONA 10 RT (Insert) IMPLANT
INSERTER EXTRACTOR FIX 48 (ORTHOPEDIC DISPOSABLE SUPPLIES) IMPLANT
MANIFOLD NEPTUNE II (INSTRUMENTS) ×1 IMPLANT
MARKER SKIN DUAL TIP RULER LAB (MISCELLANEOUS) ×1 IMPLANT
NS IRRIG 1000ML POUR BTL (IV SOLUTION) ×1 IMPLANT
PACK TOTAL KNEE CUSTOM (KITS) ×1 IMPLANT
PIN DRILL HDLS TROCAR 75 4PK (PIN) IMPLANT
PROTECTOR NERVE ULNAR (MISCELLANEOUS) ×1 IMPLANT
SCREW HEADED 33MM KNEE (MISCELLANEOUS) IMPLANT
SET HNDPC FAN SPRY TIP SCT (DISPOSABLE) ×1 IMPLANT
SOLUTION IRRIG SURGIPHOR (IV SOLUTION) IMPLANT
SPIKE FLUID TRANSFER (MISCELLANEOUS) ×1 IMPLANT
STRIP CLOSURE SKIN 1/2X4 (GAUZE/BANDAGES/DRESSINGS) ×1 IMPLANT
SUT MNCRL AB 3-0 PS2 18 (SUTURE) ×1 IMPLANT
SUT STRATAFIX 0 PDS 27 VIOLET (SUTURE) ×1
SUT STRATAFIX PDO 1 14 VIOLET (SUTURE) ×1
SUT STRATFX PDO 1 14 VIOLET (SUTURE) ×1
SUT VIC AB 2-0 CT2 27 (SUTURE) ×2 IMPLANT
SUTURE STRATFX 0 PDS 27 VIOLET (SUTURE) ×1 IMPLANT
SUTURE STRATFX PDO 1 14 VIOLET (SUTURE) ×1 IMPLANT
SYR 50ML LL SCALE MARK (SYRINGE) ×1 IMPLANT
TIBIAL INSERT PERSONA 10 RT (Insert) ×1 IMPLANT
TRAY FOLEY MTR SLVR 14FR STAT (SET/KITS/TRAYS/PACK) IMPLANT
TUBE SUCTION HIGH CAP CLEAR NV (SUCTIONS) ×1 IMPLANT
UNDERPAD 30X36 HEAVY ABSORB (UNDERPADS AND DIAPERS) ×1 IMPLANT
WRAP KNEE MAXI GEL POST OP (GAUZE/BANDAGES/DRESSINGS) IMPLANT

## 2022-02-05 NOTE — Progress Notes (Signed)
Orthopedic Tech Progress Note Patient Details:  Jacob Rodriguez 1955-09-21 [id number] Bone Foam was applied to RLE in PACU  Ortho Devices Type of Ortho Device: Bone foam zero knee Ortho Device/Splint Location: RLE Ortho Device/Splint Interventions: Application   Post Interventions Patient Tolerated: Well  [name] E Parish Dubose 02/05/2022, 1:16 PM

## 2022-02-05 NOTE — Anesthesia Procedure Notes (Signed)
Spinal  Patient location during procedure: OR End time: 02/05/2022 9:41 AM Reason for block: surgical anesthesia Staffing Performed: anesthesiologist  Anesthesiologist: Annye Asa, MD Performed by: Annye Asa, MD Authorized by: Annye Asa, MD   Preanesthetic Checklist Completed: patient identified, IV checked, site marked, risks and benefits discussed, surgical consent, monitors and equipment checked, pre-op evaluation and timeout performed Spinal Block Patient position: sitting Prep: DuraPrep and site prepped and draped Patient monitoring: blood pressure, continuous pulse ox, cardiac monitor and heart rate Approach: midline Location: L3-4 Injection technique: single-shot Needle Needle type: Pencan and Introducer  Needle gauge: 24 G Needle length: 9 cm Assessment Events: CSF return Additional Notes Pt identified in Operating room.  Monitors applied. Working IV access confirmed. Sterile prep, drape lumbar spine.  1% lido local L 3,4.  #24ga Pencan into clear CSF L 3,4.  '15mg'$  0.75% Bupivacaine with dextrose injected with asp CSF beginning and end of injection.  Patient asymptomatic, VSS, no heme aspirated, tolerated well.  Jenita Seashore, MD

## 2022-02-05 NOTE — Interval H&P Note (Signed)

## 2022-02-05 NOTE — Evaluation (Signed)
Physical Therapy Evaluation Patient Details Name: Jacob Rodriguez MRN: [id number] DOB: 18-Feb-1956 Today's Date: 02/05/2022  History of Present Illness  Pt is a 66yo male presenting s/p R-TKA on 02/05/22. PMH: HTN, R biceps tendon repair 2018.  Clinical Impression  Jacob Rodriguez is a 66 y.o. male POD 0 s/p R-TKA. Patient reports independence with mobility at baseline. Patient is now limited by functional impairments (see PT problem list below) and requires min guard for transfers and gait with RW. Patient was able to ambulate 40 feet with RW and min guard and cues for safe walker management. Patient educated on safe sequencing for stair mobility and verbalized safe guarding position for people assisting with mobility. Patient instructed in exercises to facilitate ROM and circulation. Patient will benefit from continued skilled PT interventions to address impairments and progress towards PLOF. Patient has met mobility goals at adequate level for discharge home; will continue to follow if pt continues acute stay to progress towards Mod I goals.       Recommendations for follow up therapy are one component of a multi-disciplinary discharge planning process, led by the attending physician.  Recommendations may be updated based on patient status, additional functional criteria and insurance authorization.  Follow Up Recommendations Follow physician's recommendations for discharge plan and follow up therapies      Assistance Recommended at Discharge Frequent or constant Supervision/Assistance  Patient can return home with the following  A little help with walking and/or transfers;A little help with bathing/dressing/bathroom;Assistance with cooking/housework;Assist for transportation;Help with stairs or ramp for entrance    Equipment Recommendations Rolling walker (2 wheels)  Recommendations for Other Services       Functional Status Assessment Patient has had a recent decline in their functional  status and demonstrates the ability to make significant improvements in function in a reasonable and predictable amount of time.     Precautions / Restrictions Precautions Precautions: Knee Precaution Booklet Issued: No Precaution Comments: No pillow under the knee Restrictions Weight Bearing Restrictions: No Other Position/Activity Restrictions: wbat      Mobility  Bed Mobility Overal bed mobility: Needs Assistance Bed Mobility: Supine to Sit     Supine to sit: Modified independent (Device/Increase time)     General bed mobility comments: increased time    Transfers Overall transfer level: Needs assistance Equipment used: Rolling walker (2 wheels) Transfers: Sit to/from Stand Sit to Stand: Min guard, From elevated surface           General transfer comment: Min guard for safety, VCs for powering up off stretcher.    Ambulation/Gait Ambulation/Gait assistance: Min guard Gait Distance (Feet): 40 Feet Assistive device: Rolling walker (2 wheels) Gait Pattern/deviations: Step-to pattern Gait velocity: decreased     General Gait Details: Pt ambulated with RW and min guard, no physical assist required or overt LOB noted, VCs for sequencing.  Stairs Stairs: Yes Stairs assistance: Min guard Stair Management: One rail Left, Step to pattern, Forwards, With cane Number of Stairs: 4 General stair comments: Provided handout. Pt completed stairs with min guard, moderate verbal cues for sequencing and hand placement. No overt LOB noted or physical assist required.  Wheelchair Mobility    Modified Rankin (Stroke Patients Only)       Balance Overall balance assessment: Needs assistance Sitting-balance support: Feet supported, No upper extremity supported Sitting balance-Leahy Scale: Poor     Standing balance support: Reliant on assistive device for balance, During functional activity, Bilateral upper extremity supported Standing balance-Leahy Scale: Poor  Pertinent Vitals/Pain Pain Assessment Pain Assessment: 0-10 Pain Score: 6  Pain Location: right knee Pain Descriptors / Indicators: Operative site guarding Pain Intervention(s): Limited activity within patient's tolerance, Monitored during session, Repositioned    Home Living Family/patient expects to be discharged to:: Private residence Living Arrangements: Spouse/significant other Available Help at Discharge: Family;Available 24 hours/day Type of Home: House Home Access: Stairs to enter Entrance Stairs-Rails: Left (3 steps without a railing) Entrance Stairs-Number of Steps: 6 Alternate Level Stairs-Number of Steps: 16 Home Layout: Two level;Able to live on main level with bedroom/bathroom Home Equipment: Shower seat - built in;Cane - single point      Prior Function Prior Level of Function : Independent/Modified Independent;Working/employed;Driving Glass blower/designer)             Mobility Comments: IND ADLs Comments: IND     Hand Dominance        Extremity/Trunk Assessment   Upper Extremity Assessment Upper Extremity Assessment: Overall WFL for tasks assessed    Lower Extremity Assessment Lower Extremity Assessment: RLE deficits/detail;LLE deficits/detail RLE Deficits / Details: MMT ank df/pf 5/5, no extensor lag noted RLE Sensation: WNL LLE Deficits / Details: MMT ank DF/PF 5/5 LLE Sensation: WNL    Cervical / Trunk Assessment Cervical / Trunk Assessment: Kyphotic  Communication   Communication: No difficulties  Cognition Arousal/Alertness: Awake/alert Behavior During Therapy: WFL for tasks assessed/performed Overall Cognitive Status: Within Functional Limits for tasks assessed                                          General Comments General comments (skin integrity, edema, etc.): Wife Neoma Laming present    Exercises Total Joint Exercises Ankle Circles/Pumps: AROM, Both, 10 reps Quad Sets: AROM, Right, Other  reps (comment) (2) Short Arc Quad: AROM, Right Heel Slides: Right, AROM, Other reps (comment) (2) Hip ABduction/ADduction: AROM, Right, Other reps (comment) (2) Straight Leg Raises: AROM, Right, Other reps (comment) (3) Goniometric ROM: 0-60deg by gross visual approximation   Assessment/Plan    PT Assessment Patient needs continued PT services  PT Problem List Decreased strength;Decreased range of motion;Decreased activity tolerance;Decreased balance;Decreased mobility;Decreased coordination;Pain       PT Treatment Interventions DME instruction;Gait training;Stair training;Functional mobility training;Therapeutic activities;Therapeutic exercise;Balance training;Neuromuscular re-education;Patient/family education    PT Goals (Current goals can be found in the Care Plan section)  Acute Rehab PT Goals Patient Stated Goal: To get back on the golf course PT Goal Formulation: With patient Time For Goal Achievement: 02/12/22 Potential to Achieve Goals: Good    Frequency 7X/week     Co-evaluation               AM-PAC PT "6 Clicks" Mobility  Outcome Measure Help needed turning from your back to your side while in a flat bed without using bedrails?: None Help needed moving from lying on your back to sitting on the side of a flat bed without using bedrails?: A Little Help needed moving to and from a bed to a chair (including a wheelchair)?: A Little Help needed standing up from a chair using your arms (e.g., wheelchair or bedside chair)?: A Little Help needed to walk in hospital room?: A Little Help needed climbing 3-5 steps with a railing? : A Little 6 Click Score: 19    End of Session Equipment Utilized During Treatment: Gait belt Activity Tolerance: Patient tolerated treatment well;No increased pain Patient left: in chair;with  call bell/phone within reach Nurse Communication: Mobility status PT Visit Diagnosis: Pain;Difficulty in walking, not elsewhere classified  (R26.2) Pain - Right/Left: Right Pain - part of body: Knee    Time: 8453-6468 PT Time Calculation (min) (ACUTE ONLY): 35 min   Charges:   PT Evaluation $PT Eval Low Complexity: 1 Low PT Treatments $Gait Training: 8-22 mins        Coolidge Breeze, PT, DPT [facility] Department Office: [phone] Weekend pager: [phone]  Coolidge Breeze 02/05/2022, 4:06 PM

## 2022-02-05 NOTE — Anesthesia Procedure Notes (Signed)
Date/Time: 02/05/2022 9:26 AM  Performed by: Cynda Familia, CRNAPre-anesthesia Checklist: Patient identified, Emergency Drugs available, Suction available, Patient being monitored and Timeout performed Patient Re-evaluated:Patient Re-evaluated prior to induction Oxygen Delivery Method: Simple face mask Placement Confirmation: positive ETCO2 and breath sounds checked- equal and bilateral Dental Injury: Teeth and Oropharynx as per pre-operative assessment

## 2022-02-05 NOTE — Discharge Instructions (Signed)

## 2022-02-05 NOTE — Anesthesia Procedure Notes (Signed)
Anesthesia Regional Block: Adductor canal block   Pre-Anesthetic Checklist: , timeout performed,  Correct Patient, Correct Site, Correct Laterality,  Correct Procedure, Correct Position, site marked,  Risks and benefits discussed,  Surgical consent,  Pre-op evaluation,  At surgeon's request and post-op pain management  Laterality: Right and Lower  Prep: chloraprep       Needles:  Injection technique: Single-shot  Needle Type: Echogenic Needle     Needle Length: 9cm  Needle Gauge: 21     Additional Needles:   Procedures:,,,, ultrasound used (permanent image in chart),,    Narrative:  Start time: 02/05/2022 9:13 AM End time: 02/05/2022 9:19 AM Injection made incrementally with aspirations every 5 mL.  Performed by: Personally  Anesthesiologist: Annye Asa, MD  Additional Notes: Pt identified in Holding room.  Monitors applied. Working IV access confirmed. Sterile prep R thigh.  #21ga ECHOgenic Arrow block needle into adductor canal with US guidance.  20cc 0.75% Ropivacaine injected incrementally after negative test dose.  Patient asymptomatic, VSS, no heme aspirated, tolerated well.   Jenita Seashore, MD

## 2022-02-05 NOTE — Op Note (Signed)
DATE OF SURGERY:  02/05/2022 TIME: 11:26 AM  PATIENT NAME:  Jacob Rodriguez   AGE: 66 y.o.    PRE-OPERATIVE DIAGNOSIS: End-stage right knee osteoarthritis  POST-OPERATIVE DIAGNOSIS:  Same  PROCEDURE: Right total Knee Arthroplasty  SURGEON:  Connor Foxworthy A Michale Emmerich, MD   ASSISTANT: Izola Price, RNFA, present and scrubbed throughout the case, critical for assistance with exposure, retraction, instrumentation, and closure.   OPERATIVE IMPLANTS:  Press-fit Zimmer persona size 11 standard femur, press-fit size G tibia Osseo Ti, 10 mm MC poly, 35 mm patella Implant Name Type Inv. Item Serial No. Manufacturer Lot No. LRB No. Used Action  COMP FEM CMT PS STD 11 RT - [id number] Joint COMP FEM CMT PS STD 11 RT  [employer]MER [employer](ORTH,TRAU,BIO,SG) [id number] Right 1 Implanted  COMP TIB KNEE PS G 0 RT - [id number] Joint COMP TIB KNEE PS G 0 RT  [employer](ORTH,TRAU,BIO,SG) [id number] Right 1 Implanted  COMP PATELLAR 10X35 METAL - [id number] Joint COMP PATELLAR 10X35 METAL  [employer]IMMER [employer]ON(ORTH,TRAU,BIO,SG) [id number] Right 1 Implanted  TIBIAL INSERT PERSONA 10 RT - [id number] Insert TIBIAL INSERT PERSONA 10 RT  ZIMMER [employer]ON(ORTH,TRAU,BIO,SG) [id number] Right 1 Implanted      PREOPERATIVE INDICATIONS:  Jacob Rodriguez is a 66 y.o. year old male with end stage bone on bone degenerative arthritis of the knee who failed conservative treatment, including injections, antiinflammatories, activity modification, and assistive devices, and had significant impairment of their activities of daily living, and elected for Total Knee Arthroplasty.   The risks, benefits, and alternatives were discussed at length including but not limited to the risks of infection, bleeding, nerve injury, stiffness, blood clots, the need for revision surgery, cardiopulmonary complications, among others, and they were willing to proceed.  ESTIMATED BLOOD LOSS: 50cc  OPERATIVE DESCRIPTION:   Once adequate anesthesia was induced, preoperative  antibiotics, 2 gm of ancef,1 gm of Tranexamic Acid, and 8 mg of Decadron administered, the patient was positioned supine with a right thigh tourniquet placed.  The right lower extremity was prepped and draped in sterile fashion.  A time-  out was performed identifying the patient, planned procedure, and the appropriate extremity.     The leg was  exsanguinated, tourniquet elevated to 250 mmHg.  A midline incision was  made followed by median parapatellar arthrotomy. Anterior horn of the medial meniscus was released and resected. A medial release was performed, the infrapatellar fat pad was resected with care taken to protect the patellar tendon. The suprapatellar fat was removed to exposed the distal anterior femur. The anterior horn of the lateral meniscus and ACL were released.    Following initial  exposure, I first started with the femur  The femoral  canal was opened with a drill, canal was suctioned to try to prevent fat emboli.  An  intramedullary rod was passed set at 5 degrees valgus, 11 mm given 5degree preop flexion contracture. The distal femur was resected.  Following this resection, the tibia was  subluxated anteriorly.  Using the extramedullary guide, 10 mm of bone was resected off   the proximal lateral tibia.  We confirmed the gap would be  stable medially and laterally with a size 75m spacer block as well as confirmed that the tibial cut was perpendicular in the coronal plane, checking with an alignment rod.    Once this was done, the posterior femoral referencing femoral sizer was placed under to the posterior condyles with 3 degrees of external rotational which was parallel to the transepicondylar axis and perpendicular to  Whitesides line. The femur was sized to be a size 11 in the anterior-  posterior dimension. The  anterior, posterior, and  chamfer cuts were made without difficulty nor   notching making certain that I was along the anterior cortex to help  with flexion gap stability.  Next a laminar spreader was placed with the knee in flexion and the medial lateral menisci were resected.  5 cc of the Exparel mixture was injected in the medial side of the back of the knee and 3 cc in the lateral side.  1/2 inch curved osteotome was used to resect posterior osteophyte that was then removed with a pituitary rongeur.       At this point, the tibia was sized to be a size G.  The size G tray was  then pinned in position. Trial reduction was now carried with a 11 femur, G tibia, a 10 mm MC insert.  The knee had full extension and was stable to varus valgus stress in extension.  The knee was tight in flexion and the PCL was completely released  Attention was next directed to the patella.  Precut  measurement was noted to be 24 mm.  I resected down to 15 mm and used a  12m patellar button to restore patellar height as well as cover the cut surface.     The patella lug holes were drilled and a 35 mm patella poly trial was placed.    The knee was brought to full extension with good flexion stability with the patella tracking through the trochlea without application of pressure.     Next the femoral component was again assessed and determined to be seated and appropriately lateralized.  The femoral lug holes were drilled.  The femoral component was then removed. Tibial component was again assessed and felt to be seated and appropriately rotated with the medial third of the tubercle. The tibia was then drilled, and keel punched.       Final implants were then  pressfit onto cleaned and dried cut surfaces of bone with the knee brought to extension with a 10 mm MC poly.  The knee was irrigated with sterile Betadine diluted in saline as well as pulse lavage normal saline. The synovial lining was  then injected a dilute Exparel.     I confirmed that I was satisfied with the range of motion and stability, and the final 10 mm MC poly insert was chosen.  It was placed into the knee.         The  tourniquet had been let down.  No significant hemostasis was required.  The medial parapatellar arthrotomy was then reapproximated using #1 Stratafix sutures with the knee  in flexion.  The remaining wound was closed with 0 stratafix, 2-0 Vicryl, and running 3-0 Monocryl. The knee was cleaned, dried, dressed sterilely using Dermabond and   Aquacel dressing.  The patient was then brought to recovery room in stable condition, tolerating the procedure  well. There were no complications.   Post op recs: WB: WBAT Abx: ancef Imaging: PACU xrays DVT prophylaxis: Aspirin '81mg'$  BID x4 weeks Follow up: 2 weeks after surgery for a wound [facility]  [address] [place] [address] Office Phone: [phone] DCharlies Constable MD Orthopaedic Surgery

## 2022-02-05 NOTE — Anesthesia Postprocedure Evaluation (Signed)
Anesthesia Post Note  Patient: Jacob Rodriguez  Procedure(s) Performed: TOTAL KNEE ARTHROPLASTY (Right: Knee)     Patient location during evaluation: Phase II Anesthesia Type: Spinal Level of consciousness: awake and alert, patient cooperative and oriented Pain management: pain level controlled Vital Signs Assessment: vitals unstable and post-procedure vital signs reviewed and stable Respiratory status: spontaneous breathing, nonlabored ventilation and respiratory function stable Cardiovascular status: blood pressure returned to baseline and stable Postop Assessment: spinal receding, patient able to bend at knees, no apparent nausea or vomiting and adequate PO intake Anesthetic complications: no   No notable events documented.  Last Vitals:  Vitals:   02/05/22 1306 02/05/22 1400  BP: (!) 157/95 (!) 141/100  Pulse: (!) 57 64  Resp: 16 16  Temp: (!) 36.4 C   SpO2: 99% 99%    Last Pain:  Vitals:   02/05/22 1400  PainSc: 0-No pain                 Celestina Gironda,E. Sonam Wandel

## 2022-02-05 NOTE — Transfer of Care (Signed)
Immediate Anesthesia Transfer of Care Note  Patient: EVANS LEVEE  Procedure(s) Performed: TOTAL KNEE ARTHROPLASTY (Right: Knee)  Patient Location: PACU  Anesthesia Type:Spinal  Level of Consciousness: awake  Airway & Oxygen Therapy: Patient Spontanous Breathing and Patient connected to face mask oxygen  Post-op Assessment: Report given to RN and Post -op Vital signs reviewed and stable  Post vital signs: Reviewed and stable  Last Vitals:  Vitals Value Taken Time  BP 170/96 02/05/22 1157  Temp    Pulse 57 02/05/22 1159  Resp 13 02/05/22 1159  SpO2 100 % 02/05/22 1159  Vitals shown include unvalidated device data.  Last Pain:  Vitals:   02/05/22 0918  PainSc: 0-No pain      Patients Stated Pain Goal: 4 (05/28/34 1224)  Complications: No notable events documented.

## 2022-02-08 ENCOUNTER — Encounter (HOSPITAL_COMMUNITY): Payer: Self-pay | Admitting: Orthopedic Surgery

## 2022-02-08 ENCOUNTER — Telehealth: Payer: Self-pay

## 2022-02-08 NOTE — Telephone Encounter (Signed)
Transition Care Management Unsuccessful Follow-up Telephone Call  Date of discharge and from where:  02/05/22 [facility]. Dx: Total Knee Arthroplasty.  Attempts:  1st Attempt  Reason for unsuccessful TCM follow-up call:  Left voice message

## 2022-02-09 NOTE — Telephone Encounter (Signed)
Transition Care Management Follow-up Telephone Call Date of discharge and from where: 02/05/22 [facility]. Dx: Total Right Knee Arthroplasty. How have you been since you were released from the hospital? I'm doing good, fully ambulatory, going to therapy, doing what I'm supposed to do. Any questions or concerns? No  Items Reviewed: Did the pt receive and understand the discharge instructions provided? No  Medications obtained and verified? Yes  Other? No  Any new allergies since your discharge? No  Dietary orders reviewed? No Do you have support at home? Yes   Home Care and Equipment/Supplies: Were home health services ordered? not applicable If so, what is the name of the agency? N/a  Has the agency set up a time to come to the patient's home? not applicable Were any new equipment or medical supplies ordered?  No What is the name of the medical supply agency? N/a Were you able to get the supplies/equipment? not applicable Do you have any questions related to the use of the equipment or supplies? No  Functional Questionnaire: (I = Independent and D = Dependent) ADLs: I  Bathing/Dressing- I  Meal Prep- I  Eating- I  Maintaining continence- I  Transferring/Ambulation- I  Managing Meds- I  Follow up appointments reviewed:  PCP Hospital f/u appt confirmed? No  Scheduled to see n/a on n/a @ n/a. Pt states he already has an appt sched to see Dr. Ethelene Hal on 02/19/22. This appt is an OV not a HOSP F/U.  [facility] f/u appt confirmed? No  Scheduled to see n/a on n/a @ n/a. Are transportation arrangements needed? No  If their condition worsens, is the pt aware to call PCP or go to the Emergency Dept.? Yes Was the patient provided with contact information for the PCP's office or ED? Yes Was to pt encouraged to call back with questions or concerns? Yes

## 2022-02-19 ENCOUNTER — Ambulatory Visit: Payer: No Typology Code available for payment source | Admitting: Family Medicine

## 2022-04-27 ENCOUNTER — Other Ambulatory Visit: Payer: Self-pay | Admitting: Family Medicine

## 2022-04-27 DIAGNOSIS — I10 Essential (primary) hypertension: Secondary | ICD-10-CM

## 2022-05-12 ENCOUNTER — Other Ambulatory Visit: Payer: Self-pay | Admitting: Family Medicine

## 2022-05-12 DIAGNOSIS — I10 Essential (primary) hypertension: Secondary | ICD-10-CM

## 2022-06-25 ENCOUNTER — Other Ambulatory Visit: Payer: Self-pay | Admitting: Family Medicine

## 2022-06-25 DIAGNOSIS — I10 Essential (primary) hypertension: Secondary | ICD-10-CM

## 2022-07-20 ENCOUNTER — Telehealth: Payer: Self-pay | Admitting: Family Medicine

## 2022-07-20 ENCOUNTER — Other Ambulatory Visit: Payer: Self-pay

## 2022-07-20 DIAGNOSIS — I10 Essential (primary) hypertension: Secondary | ICD-10-CM

## 2022-07-20 MED ORDER — IRBESARTAN 300 MG PO TABS: ORAL_TABLET | ORAL | 1 refills | Status: DC

## 2022-07-20 NOTE — Telephone Encounter (Signed)
I sent the Rx to [facility] per pt request.

## 2022-07-20 NOTE — Telephone Encounter (Signed)
Rakeen [phone]   Pt needs his irbesartan refilled. He stated he did not receive 90 tablets as shown in his order but only 30. Please refill He has 3 pills left [facility] in [place]

## 2022-10-21 ENCOUNTER — Other Ambulatory Visit: Payer: Self-pay | Admitting: Family Medicine

## 2022-10-21 DIAGNOSIS — I10 Essential (primary) hypertension: Secondary | ICD-10-CM

## 2022-12-15 ENCOUNTER — Telehealth: Payer: Self-pay | Admitting: Family Medicine

## 2022-12-15 NOTE — Telephone Encounter (Signed)
Pt want to know if he have to fast today/tonight before his appt tomorrow

## 2022-12-16 ENCOUNTER — Encounter: Payer: Self-pay | Admitting: Family Medicine

## 2022-12-16 ENCOUNTER — Ambulatory Visit (INDEPENDENT_AMBULATORY_CARE_PROVIDER_SITE_OTHER): Payer: No Typology Code available for payment source | Admitting: Family Medicine

## 2022-12-16 VITALS — BP 150/88 | HR 59 | Temp 96.4°F | Ht 73.0 in | Wt 223.0 lb

## 2022-12-16 DIAGNOSIS — Z0000 Encounter for general adult medical examination without abnormal findings: Secondary | ICD-10-CM | POA: Diagnosis not present

## 2022-12-16 DIAGNOSIS — Z131 Encounter for screening for diabetes mellitus: Secondary | ICD-10-CM

## 2022-12-16 DIAGNOSIS — I10 Essential (primary) hypertension: Secondary | ICD-10-CM

## 2022-12-16 DIAGNOSIS — Z23 Encounter for immunization: Secondary | ICD-10-CM

## 2022-12-16 DIAGNOSIS — Z13220 Encounter for screening for lipoid disorders: Secondary | ICD-10-CM

## 2022-12-16 DIAGNOSIS — Z125 Encounter for screening for malignant neoplasm of prostate: Secondary | ICD-10-CM

## 2022-12-16 NOTE — Telephone Encounter (Signed)
Left detailed message for the patient to make sure he is fasting and very well hydrated with water.

## 2022-12-16 NOTE — Progress Notes (Signed)
Established Patient Office Visit   Subjective:  Patient ID: Jacob Rodriguez, male    DOB: 13-Aug-1955  Age: 67 y.o. MRN: [id number]  No chief complaint on file.   HPI Encounter Diagnoses  Name Primary?   Healthcare maintenance Yes   White coat syndrome with diagnosis of hypertension    Screening for diabetes mellitus    Screening for prostate cancer    Screening for cholesterol level    Need for immunization against influenza    For yearly physical, preop clearance, follow-up for hypertension and insomnia.  Insomnia is mostly resolved because he is no longer traveling.  Alfonso Patten was not effective and he is no longer using the Ambien.  He has been taking Advil PM.  Assures compliance with his Avapro and carvedilol extended release.  Blood pressure at home has been running running in the 120s over 60s.  Last knee surgery was complicated by elevated blood pressure.  He has regular dental care.  Exercises limited secondary to knee pain.   Review of Systems  Constitutional: Negative.   HENT: Negative.    Eyes:  Negative for blurred vision, discharge and redness.  Respiratory: Negative.  Negative for shortness of breath.   Cardiovascular: Negative.  Negative for chest pain.  Gastrointestinal:  Negative for abdominal pain.  Genitourinary: Negative.   Musculoskeletal:  Positive for joint pain. Negative for myalgias.  Skin:  Negative for rash.  Neurological:  Negative for tingling, loss of consciousness and weakness.  Endo/Heme/Allergies:  Negative for polydipsia.     Current Outpatient Medications:    carvedilol (COREG CR) 20 MG 24 hr capsule, TAKE 1 CAPSULE AT BEDTIME, Disp: 90 capsule, Rfl: 0   fluticasone (CUTIVATE) 0.05 % cream, Apply 1 Application topically 2 (two) times daily as needed (irritation)., Disp: , Rfl:    glucosamine-chondroitin 500-400 MG tablet, Take 1 tablet by mouth daily., Disp: , Rfl:    irbesartan (AVAPRO) 300 MG tablet, TAKE 1 TABLET(300 MG) BY MOUTH DAILY,  Disp: 90 tablet, Rfl: 1   Multiple Vitamin (MULTIVITAMIN) tablet, Take 1 tablet by mouth daily., Disp: , Rfl:    PROPECIA 1 MG tablet, TAKE 1 TABLET(1 MG) BY MOUTH DAILY (Patient taking differently: Take 0.25 mg by mouth daily.), Disp: 30 tablet, Rfl: 3   sildenafil (REVATIO) 20 MG tablet, May take 3 to 5 tablets 45 minutes prior with no more than 5 tablets daily., Disp: 25 tablet, Rfl: 0   TURMERIC PO, Take 1 tablet by mouth daily., Disp: , Rfl:    valACYclovir (VALTREX) 1000 MG tablet, TAKE 1/2 TABLET(500 MG) BY MOUTH TWICE DAILY FOR 3 DAYS AS NEEDED FOR OUTBREAK, Disp: 10 tablet, Rfl: 0   VITAMIN D PO, Take 1 tablet by mouth daily., Disp: , Rfl:    zolpidem (AMBIEN CR) 6.25 MG CR tablet, Take 1 tablet (6.25 mg total) by mouth at bedtime as needed for sleep., Disp: 20 tablet, Rfl: 0   Objective:     BP (!) 164/82   Pulse (!) 59   Temp (!) 96.4 F (35.8 C)   Ht 6\' 1"  (1.854 m)   Wt 223 lb (101.2 kg)   SpO2 99%   BMI 29.42 kg/m  BP Readings from Last 3 Encounters:  12/16/22 (!) 164/82  02/05/22 (!) 148/88  01/26/22 (!) 159/80   Wt Readings from Last 3 Encounters:  12/16/22 223 lb (101.2 kg)  02/05/22 216 lb (98 kg)  01/26/22 216 lb (98 kg)      Physical Exam Constitutional:  General: He is not in acute distress.    Appearance: Normal appearance. He is not ill-appearing, toxic-appearing or diaphoretic.  HENT:     Head: Normocephalic and atraumatic.     Right Ear: Tympanic membrane, ear canal and external ear normal.     Left Ear: Tympanic membrane, ear canal and external ear normal.     Mouth/Throat:     Mouth: Mucous membranes are moist.     Pharynx: Oropharynx is clear. No oropharyngeal exudate or posterior oropharyngeal erythema.  Eyes:     General: No scleral icterus.       Right eye: No discharge.        Left eye: No discharge.     Extraocular Movements: Extraocular movements intact.     Conjunctiva/sclera: Conjunctivae normal.     Pupils: Pupils are equal,  round, and reactive to light.  Cardiovascular:     Rate and Rhythm: Normal rate and regular rhythm.  Pulmonary:     Effort: Pulmonary effort is normal. No respiratory distress.     Breath sounds: Normal breath sounds.  Abdominal:     General: Bowel sounds are normal.     Tenderness: There is no abdominal tenderness. There is no guarding or rebound.  Musculoskeletal:     Cervical back: No rigidity or tenderness.     Left knee: Swelling present. No erythema.  Skin:    General: Skin is warm and dry.  Neurological:     Mental Status: He is alert and oriented to person, place, and time.  Psychiatric:        Mood and Affect: Mood normal.        Behavior: Behavior normal.      No results found for any visits on 12/16/22.    The 10-year ASCVD risk score (Arnett DK, et al., 2019) is: 20.6%    Assessment & Plan:   Healthcare maintenance  White coat syndrome with diagnosis of hypertension -     CBC; Future -     Comprehensive metabolic panel; Future -     Urinalysis, Routine w reflex microscopic; Future -     Ambulatory referral to Advanced Hypertension Clinic  Screening for diabetes mellitus -     Hemoglobin A1c; Future  Screening for prostate cancer -     PSA; Future  Screening for cholesterol level -     Lipid panel; Future  Need for immunization against influenza -     Flu Vaccine Trivalent High Dose (Fluad)    Return in about 3 months (around 03/17/2023), or if symptoms worsen or fail to improve.  Will send to hypertension clinic.  He may need ambulatory blood pressure monitoring.  Continue Avapro and carvedilol XL.  Information was given on managing hypertension.  Information was given on health maintenance and disease prevention.  Llana Aliment, MD

## 2022-12-23 ENCOUNTER — Other Ambulatory Visit (INDEPENDENT_AMBULATORY_CARE_PROVIDER_SITE_OTHER): Payer: No Typology Code available for payment source

## 2022-12-23 ENCOUNTER — Encounter: Payer: Self-pay | Admitting: Family Medicine

## 2022-12-23 ENCOUNTER — Ambulatory Visit (INDEPENDENT_AMBULATORY_CARE_PROVIDER_SITE_OTHER): Payer: No Typology Code available for payment source

## 2022-12-23 DIAGNOSIS — Z23 Encounter for immunization: Secondary | ICD-10-CM | POA: Diagnosis not present

## 2022-12-23 DIAGNOSIS — Z125 Encounter for screening for malignant neoplasm of prostate: Secondary | ICD-10-CM

## 2022-12-23 DIAGNOSIS — Z13220 Encounter for screening for lipoid disorders: Secondary | ICD-10-CM | POA: Diagnosis not present

## 2022-12-23 DIAGNOSIS — Z131 Encounter for screening for diabetes mellitus: Secondary | ICD-10-CM

## 2022-12-23 DIAGNOSIS — I10 Essential (primary) hypertension: Secondary | ICD-10-CM

## 2022-12-23 LAB — COMPREHENSIVE METABOLIC PANEL
ALT: 24 U/L (ref 0–53)
AST: 21 U/L (ref 0–37)
Albumin: 4.5 g/dL (ref 3.5–5.2)
Alkaline Phosphatase: 57 U/L (ref 39–117)
BUN: 10 mg/dL (ref 6–23)
CO2: 26 mEq/L (ref 19–32)
Calcium: 9.5 mg/dL (ref 8.4–10.5)
Chloride: 107 mEq/L (ref 96–112)
Creatinine, Ser: 0.78 mg/dL (ref 0.40–1.50)
GFR: 93.24 mL/min (ref >60.00)
Glucose, Bld: 111 mg/dL — ABNORMAL HIGH (ref 70–99)
Potassium: 5.1 mEq/L (ref 3.5–5.1)
Sodium: 140 mEq/L (ref 135–145)
Total Bilirubin: 1.3 mg/dL — ABNORMAL HIGH (ref 0.2–1.2)
Total Protein: 6.9 g/dL (ref 6.0–8.3)

## 2022-12-23 LAB — URINALYSIS, ROUTINE W REFLEX MICROSCOPIC
Bilirubin Urine: NEGATIVE
Hgb urine dipstick: NEGATIVE
Ketones, ur: NEGATIVE
Leukocytes,Ua: NEGATIVE
Nitrite: NEGATIVE
RBC / HPF: NONE SEEN (ref 0–?)
Specific Gravity, Urine: <=1.005 — AB (ref 1.000–1.030)
Total Protein, Urine: NEGATIVE
Urine Glucose: NEGATIVE
Urobilinogen, UA: 0.2 (ref 0.0–1.0)
WBC, UA: NONE SEEN (ref 0–?)
pH: 6.5 (ref 5.0–8.0)

## 2022-12-23 LAB — CBC
HCT: 40.4 % (ref 39.0–52.0)
Hemoglobin: 13.6 g/dL (ref 13.0–17.0)
MCHC: 33.6 g/dL (ref 30.0–36.0)
MCV: 96.9 fl (ref 78.0–100.0)
Platelets: 286 10*3/uL (ref 150.0–400.0)
RBC: 4.17 Mil/uL — ABNORMAL LOW (ref 4.22–5.81)
RDW: 13.3 % (ref 11.5–15.5)
WBC: 5.9 10*3/uL (ref 4.0–10.5)

## 2022-12-23 LAB — LIPID PANEL
Cholesterol: 194 mg/dL (ref 0–200)
HDL: 64 mg/dL (ref >39.00)
LDL Cholesterol: 113 mg/dL — ABNORMAL HIGH (ref 0–99)
NonHDL: 130.16
Total CHOL/HDL Ratio: 3
Triglycerides: 84 mg/dL (ref 0.0–149.0)
VLDL: 16.8 mg/dL (ref 0.0–40.0)

## 2022-12-23 LAB — PSA: PSA: 1.06 ng/mL (ref 0.10–4.00)

## 2022-12-23 LAB — HEMOGLOBIN A1C: Hgb A1c MFr Bld: 4.7 % (ref 4.6–6.5)

## 2022-12-23 NOTE — Progress Notes (Signed)
Per orders of Dr Doreene Burke, flu shot given in RT deltoid by Mickle Plumb, cma.  Patient tolerated injection well.

## 2022-12-24 ENCOUNTER — Telehealth: Payer: Self-pay

## 2022-12-24 NOTE — Telephone Encounter (Signed)
...  Pre-operative Risk Assessment    Patient Name: Jacob Rodriguez  DOB: 1956/04/01 MRN: [id number]      Request for Surgical Clearance    Procedure:   LEFT TOTAL KNEE REPLACEMENT   Date of Surgery:  TBD For convenience, highlight and copy (CTL+C) the Clearance MM/DD/YY phrase above. Click here to go to Reason for Visit.  Paste (CTL+V) the date.  Engineer, drilling.  Then click button underneath called Add Clearance MM/DD/YY as free text.     :1}    Surgeon:  DR Weber Cooks Surgeon's Group or Practice Name:  [facility] Phone number:  [phone] Fax number:  [phone]   Type of Clearance Requested:   - Medical    Type of Anesthesia:  Spinal   Additional requests/questions:   NEW PATIENT APPT 02/17/23 WITH CATLIN WALKER  Signed, Renee Ramus   12/24/2022, 9:40 AM

## 2022-12-27 NOTE — Telephone Encounter (Addendum)
Name: JORAH GROSSE  DOB: 1955/05/15  MRN: [id number]  Primary Cardiologist: None - has not seen [facility] before  Chart reviewed as part of pre-operative protocol coverage. Because of Champion Phelan Thomason's past medical history and time since last visit, he will require a follow-up in-office visit in order to better assess preoperative cardiovascular risk.  Patient has an advanced HTN clinic visit scheduled 02/17/23 with APP Gillian Shields, NP but per discussion with NP supervisor Ward Givens, would recommend establishing with MD for new patient appointment for pre-operative evaluation. Should still keep the Advanced HTN clinic visit with [facility] 02/17/23 as this is for separate purpose.   Pre-op covering staff: - Please schedule appointment and call patient to inform them. - Please contact requesting surgeon's office via preferred method (i.e, phone, fax) to inform them of need for appointment prior to surgery.  No pharm needs identified.  Laurann Montana, PA-C  12/27/2022, 9:27 AM

## 2022-12-27 NOTE — Telephone Encounter (Signed)
Pt is scheduled with gen card Dr. Carolan Clines on 02/16/23.

## 2023-01-03 ENCOUNTER — Other Ambulatory Visit: Payer: Self-pay | Admitting: Family Medicine

## 2023-01-03 DIAGNOSIS — I10 Essential (primary) hypertension: Secondary | ICD-10-CM

## 2023-01-19 ENCOUNTER — Encounter (HOSPITAL_BASED_OUTPATIENT_CLINIC_OR_DEPARTMENT_OTHER): Payer: Self-pay

## 2023-01-19 ENCOUNTER — Encounter (HOSPITAL_BASED_OUTPATIENT_CLINIC_OR_DEPARTMENT_OTHER): Payer: Self-pay | Admitting: Cardiovascular Disease

## 2023-01-19 ENCOUNTER — Ambulatory Visit (INDEPENDENT_AMBULATORY_CARE_PROVIDER_SITE_OTHER): Payer: No Typology Code available for payment source | Admitting: Cardiovascular Disease

## 2023-01-19 VITALS — BP 186/88 | HR 58 | Ht 73.0 in | Wt 227.2 lb

## 2023-01-19 DIAGNOSIS — I10 Essential (primary) hypertension: Secondary | ICD-10-CM | POA: Insufficient documentation

## 2023-01-19 DIAGNOSIS — Z006 Encounter for examination for normal comparison and control in clinical research program: Secondary | ICD-10-CM

## 2023-01-19 HISTORY — DX: Essential (primary) hypertension: I10

## 2023-01-19 MED ORDER — HYDROCHLOROTHIAZIDE 25 MG PO TABS
25.0000 mg | ORAL_TABLET | Freq: Every day | ORAL | 3 refills | Status: DC
Start: 2023-01-19 — End: 2023-02-16

## 2023-01-19 NOTE — Progress Notes (Signed)
Advanced Hypertension Clinic Initial Assessment:    Date:  01/19/2023   ID:  Jacob Rodriguez, DOB 1955-11-09, MRN [id number]  PCP:  Jacob Sax, MD  Cardiologist:  None  Nephrologist:  Referring MD: Jacob Rodriguez,*   CC: Hypertension  History of Present Illness:    Jacob Rodriguez is a 67 y.o. male Jacob Rodriguez is a 67 y.o. male with a hx of hypertension here to establish care in the Advanced Hypertension Clinic. He has worked with Dr. Iantha Rodriguez and BP has been stable at home on irbesartan and carvedilol.  However, office BP has been above goal.   Jacob Rodriguez was seen for surgery and BP was 164/82 in the office.  He has known white coat hypertension and was referred to the Advanced Hypertension Clinic.  Today, he confirms having high blood pressure for about 1.5 years, gradually increasing. In the office his blood pressure is 177/94 initially, and 186/88 on manual recheck. At home his blood pressure is consistently averaging 155-160/82-84. On 02/05/2022 he underwent a right knee replacement, blood pressure was high at that time as well. Currently his left total knee arthroplasty is TBD pending improvement in his blood pressures. Secondary to his knee issue he experiences swelling only in his left ankle. He is exercising daily with either weight lifting or using the elliptical. Plays golf on the weekends. Generally he feels well without anginal symptoms or shortness of breath. Regarding his diet he primarily eats meals at home, usually with a vegetable. He doesn't add salt to his meals. Every morning he drinks 2 cups of coffee. Alcohol consumption up to 5 times per week, 2-3 drinks a night at most. For pain management he takes Advil 100 mg, usually twice a week when he plays golf. Occasionally takes Advil PM more routinely to help him sleep. When he sleeps on his side, he is able to hear his heartbeat which has been annoying. His wife has noted that he occasionally snores.  He denies any palpitations, chest pain, lightheadedness, headaches, syncope, orthopnea, or PND.  Previous antihypertensives: N/a  Past Medical History:  Diagnosis Date   Allergy    as a child   Arthritis    knees   Hypertension    Primary hypertension 01/19/2023    Past Surgical History:  Procedure Laterality Date   BICEPS TENDON REPAIR Right 2018   COLONOSCOPY     HYDROCELE EXCISION  01/09/1993   KNEE ARTHROSCOPY     rt x3   KNEE ARTHROSCOPY     x2 on left   POLYPECTOMY     ROTATOR CUFF REPAIR Right 1987   TOTAL KNEE ARTHROPLASTY Right 02/05/2022   Procedure: TOTAL KNEE ARTHROPLASTY;  Surgeon: Jacob Laura, MD;  Location: [facility];  Service: Orthopedics;  Laterality: Right;   TREATMENT FISTULA ANAL  1993    Current Medications: Current Meds  Medication Sig   carvedilol (COREG CR) 20 MG 24 hr capsule TAKE 1 CAPSULE AT BEDTIME   glucosamine-chondroitin 500-400 MG tablet Take 1 tablet by mouth daily.   hydrochlorothiazide (HYDRODIURIL) 25 MG tablet Take 1 tablet (25 mg total) by mouth daily.   irbesartan (AVAPRO) 300 MG tablet TAKE 1 TABLET DAILY   Multiple Vitamin (MULTIVITAMIN) tablet Take 1 tablet by mouth daily.   PROPECIA 1 MG tablet TAKE 1 TABLET(1 MG) BY MOUTH DAILY (Patient taking differently: Take 0.25 mg by mouth daily.)   TURMERIC PO Take 1 tablet by mouth daily.   valACYclovir (VALTREX) 1000  MG tablet TAKE 1/2 TABLET(500 MG) BY MOUTH TWICE DAILY FOR 3 DAYS AS NEEDED FOR OUTBREAK   VITAMIN D PO Take 1 tablet by mouth daily.     Allergies:   Patient has no known allergies.   Social History   Socioeconomic History   Marital status: Married    Spouse name: Not on file   Number of children: Not on file   Years of education: Not on file   Highest education level: Not on file  Occupational History   Not on file  Tobacco Use   Smoking status: Never   Smokeless tobacco: Never  Vaping Use   Vaping status: Never Used  Substance and Sexual Activity    Alcohol use: Yes    Alcohol/week: 7.0 standard drinks of alcohol    Types: 7 Standard drinks or equivalent per week    Comment: 4 times a week couple drinks   Drug use: No   Sexual activity: Yes  Other Topics Concern   Not on file  Social History Narrative   Not on file   Social Determinants of Health   Financial Resource Strain: Not on file  Food Insecurity: Not on file  Transportation Needs: Not on file  Physical Activity: Not on file  Stress: Not on file  Social Connections: Not on file     Family History: The patient's family history is negative for Colon cancer, Diabetes, Esophageal cancer, Rectal cancer, and Stomach cancer.  ROS:   Please see the history of present illness.    (+) Left knee pain (+) Left ankle swelling All other systems reviewed and are negative.  EKGs/Labs/Other Studies Reviewed:    EKG:  EKG is personally reviewed. 01/19/2023: Not ordered.  Recent Labs: 12/23/2022: ALT 24; BUN 10; Creatinine, Ser 0.78; Hemoglobin 13.6; Platelets 286.0; Potassium 5.1; Sodium 140   Recent Lipid Panel    Component Value Date/Time   CHOL 194 12/23/2022 0814   TRIG 84.0 12/23/2022 0814   HDL 64.00 12/23/2022 0814   CHOLHDL 3 12/23/2022 0814   VLDL 16.8 12/23/2022 0814   LDLCALC 113 (H) 12/23/2022 0814    Physical Exam:    VS:  BP (!) 186/88 (BP Location: Right Arm, Patient Position: Sitting, Cuff Size: Large)   Pulse (!) 58   Ht 6\' 1"  (1.854 m)   Wt 227 lb 3.2 oz (103.1 kg)   SpO2 98%   BMI 29.98 kg/m  , BMI Body mass index is 29.98 kg/m. GENERAL:  Well appearing HEENT: Pupils equal round and reactive, fundi not visualized, oral mucosa unremarkable NECK:  No jugular venous distention, waveform within normal limits, carotid upstroke brisk and symmetric, no bruits, no thyromegaly LUNGS:  Clear to auscultation bilaterally HEART:  RRR.  PMI not displaced or sustained, S1 and S2 within normal limits, no S3, no S4, no clicks, no rubs, no murmurs ABD:  Flat,  positive bowel sounds normal in frequency in pitch, no bruits, no rebound, no guarding, no midline pulsatile mass, no hepatomegaly, no splenomegaly EXT:  2 plus pulses throughout, no edema, no cyanosis, no clubbing SKIN:  No rashes, no nodules NEURO:  Cranial nerves II through XII grossly intact, motor grossly intact throughout PSYCH:  Cognitively intact, oriented to person place and time   ASSESSMENT/PLAN:    # Hypertension Elevated blood pressure readings at home (150-160 systolic) and in office (186/88). Patient has been on Irbesartan and Carvedilol. Possible contributing factors include chronic NSAID use and alcohol consumption. No family history of heart disease. -  Discontinue chronic NSAID use, replace with Tylenol as needed. -Limit alcohol consumption to no more than 2 drinks per day. -Add Hydrochlorothiazide once daily in the morning. -Order labs in 1 week:Basic Metabolic Panel, TSH, Renin, and Aldosterone levels. -Consider Coronary Calcium Score to assess cardiac risk. -Follow-up in 1 month with hypertension clinic. -He consents to enrolling in our remote patient monitoring program and to using the [employer] system.    # Hyperlipidemia:  We will get a coronary calcium score.  Not statin for now.   # Osteoarthritis of the Knee Bone-on-bone arthritis in the knee, causing pain and swelling. Patient is awaiting knee replacement surgery.  Once BP is better controlled he will be low risk for surgery. -Continue exercise regimen as tolerated. -Use Tylenol for pain management, limit use of NSAIDs.  # General Health Maintenance -Continue healthy diet, limit sodium intake. -Continue regular exercise regimen. -Participate in remote patient monitoring study for hypertension management. -Follow-up in office in 2 months and 4 months.      Screening for Secondary Hypertension:     01/19/2023   10:59 AM  Causes  Drugs/Herbals Screened     - Comments +NSAIDS.  +EtOH 2-3 glasses 5  nights per week.  +2 cups coffee daily  Renovascular HTN N/A  Sleep Apnea Screened     - Comments no significant symptoms  Thyroid Disease Screened     - Comments Check TSH  Hyperaldosteronism Screened  Pheochromocytoma N/A  Cushing's Syndrome N/A  Hyperparathyroidism Screened  Coarctation of the Aorta Screened  Compliance Screened    Relevant Labs/Studies:    Latest Ref Rng & Units 12/23/2022    8:14 AM 01/26/2022    9:34 AM 12/03/2021   11:19 AM  Basic Labs  Sodium 135 - 145 mEq/L 140  139  139   Potassium 3.5 - 5.1 mEq/L 5.1  4.2  4.0   Creatinine 0.40 - 1.50 mg/dL 1.19  1.47  8.29      Disposition:    FU with APP/PharmD in 1 month for the next 3 months.   FU with Ariyon Mittleman C. Duke Salvia, MD, [facility] in 4 months.  Medication Adjustments/Labs and Tests Ordered: Current medicines are reviewed at length with the patient today.  Concerns regarding medicines are outlined above.   Orders Placed This Encounter  Procedures   CT CARDIAC SCORING (SELF PAY ONLY)   TSH   Basic metabolic panel   Aldosterone + renin activity w/ ratio   Cantril's Ladder Assessment   Meds ordered this encounter  Medications   hydrochlorothiazide (HYDRODIURIL) 25 MG tablet    Sig: Take 1 tablet (25 mg total) by mouth daily.    Dispense:  90 tablet    Refill:  3   I,Mathew Stumpf,acting as a scribe for Chilton Si, MD.,have documented all relevant documentation on the behalf of Chilton Si, MD,as directed by  Chilton Si, MD while in the presence of Chilton Si, MD.  I, Caitland Porchia C. Duke Salvia, MD have reviewed all documentation for this visit.  The documentation of the exam, diagnosis, procedures, and orders on 01/19/2023 are all accurate and complete.   Signed, Chilton Si, MD  01/19/2023 1:15 PM    [facility]

## 2023-01-19 NOTE — Research (Signed)
  Subject Name: Jacob Rodriguez met inclusion and exclusion criteria for the Virtual Care and Social Determinant Interventions for the management of hypertension trial.  The informed consent form, study requirements and expectations were reviewed with the subject by Dr. Duke Salvia and myself. The subject was given the opportunity to read the consent and ask questions. The subject verbalized understanding of the trial requirements.  All questions were addressed prior to the signing of the consent form. The subject agreed to participate in the trial and signed the informed consent. The informed consent was obtained prior to performance of any protocol-specific procedures for the subject.  A copy of the signed informed consent was given to the subject and a copy was placed in the subject's medical record.  Jacob Rodriguez was randomized to Group 2.

## 2023-01-19 NOTE — Progress Notes (Deleted)
Advanced Hypertension Clinic Initial Assessment:    Date:  01/19/2023   ID:  [name], DOB 1955/09/22, MRN [id number]  PCP:  Mliss Sax, MD  Cardiologist:  None  Nephrologist:  Referring MD: Mliss Sax,*   CC: Hypertension  History of Present Illness:    Jacob Rodriguez is a 67 y.o. male with a hx of hypertension here to establish care in the Advanced Hypertension Clinic. He has worked with Dr. Iantha Fallen and BP has been stable at home on irbesartan and carvedilol.  However, office BP has been above goal.  Mr. Peter was seen for surgery and BP was 164/82 in the office.  He has known white coat hypertension and was referred to the Advanced Hypertension Clinic.  Previous antihypertensives:  Secondary Causes of Hypertension  Medications/Herbal: OCP, steroids, stimulants, antidepressants, weight loss medication, immune suppressants, NSAIDs, sympathomimetics, alcohol, caffeine, licorice, ginseng, St. John's wort, chemo  Sleep Apnea Renal artery stenosis Hyperaldosteronism Hyper/hypothyroidism Pheochromocytoma: palpitations, tachycardia, headache, diaphoresis (plasma metanephrines) Cushing's syndrome: Cushingoid facies, central obesity, proximal muscle weakness, and ecchymoses, adrenal incidentaloma (cortisol) Coarctation of the aorta  Past Medical History:  Diagnosis Date   Allergy    as a child   Arthritis    knees   Hypertension     Past Surgical History:  Procedure Laterality Date   BICEPS TENDON REPAIR Right 2018   COLONOSCOPY     HYDROCELE EXCISION  01/09/1993   KNEE ARTHROSCOPY     rt x3   KNEE ARTHROSCOPY     x2 on left   POLYPECTOMY     ROTATOR CUFF REPAIR Right 1987   TOTAL KNEE ARTHROPLASTY Right 02/05/2022   Procedure: TOTAL KNEE ARTHROPLASTY;  Surgeon: Joen Laura, MD;  Location: [facility];  Service: Orthopedics;  Laterality: Right;   TREATMENT FISTULA ANAL  1993    Current Medications: No outpatient medications  have been marked as taking for the 01/19/23 encounter (Appointment) with Chilton Si, MD.     Allergies:   Patient has no known allergies.   Social History   Socioeconomic History   Marital status: Married    Spouse name: Not on file   Number of children: Not on file   Years of education: Not on file   Highest education level: Not on file  Occupational History   Not on file  Tobacco Use   Smoking status: Never   Smokeless tobacco: Never  Vaping Use   Vaping status: Never Used  Substance and Sexual Activity   Alcohol use: Yes    Alcohol/week: 7.0 standard drinks of alcohol    Types: 7 Standard drinks or equivalent per week    Comment: 4 times a week couple drinks   Drug use: No   Sexual activity: Yes  Other Topics Concern   Not on file  Social History Narrative   Not on file   Social Determinants of Health   Financial Resource Strain: Not on file  Food Insecurity: Not on file  Transportation Needs: Not on file  Physical Activity: Not on file  Stress: Not on file  Social Connections: Not on file     Family History: The patient's ***family history is negative for Colon cancer, Diabetes, Esophageal cancer, Rectal cancer, and Stomach cancer.  ROS:   Please see the history of present illness.    *** All other systems reviewed and are negative.  EKGs/Labs/Other Studies Reviewed:    EKG:  EKG is *** ordered today.  The ekg ordered  today demonstrates ***  Recent Labs: 12/23/2022: ALT 24; BUN 10; Creatinine, Ser 0.78; Hemoglobin 13.6; Platelets 286.0; Potassium 5.1; Sodium 140   Recent Lipid Panel    Component Value Date/Time   CHOL 194 12/23/2022 0814   TRIG 84.0 12/23/2022 0814   HDL 64.00 12/23/2022 0814   CHOLHDL 3 12/23/2022 0814   VLDL 16.8 12/23/2022 0814   LDLCALC 113 (H) 12/23/2022 0814    Physical Exam:   VS:  There were no vitals taken for this visit. , BMI There is no height or weight on file to calculate BMI. GENERAL:  Well  appearing HEENT: Pupils equal round and reactive, fundi not visualized, oral mucosa unremarkable NECK:  No jugular venous distention, waveform within normal limits, carotid upstroke brisk and symmetric, no bruits, no thyromegaly LYMPHATICS:  No cervical adenopathy LUNGS:  Clear to auscultation bilaterally HEART:  RRR.  PMI not displaced or sustained,S1 and S2 within normal limits, no S3, no S4, no clicks, no rubs, *** murmurs ABD:  Flat, positive bowel sounds normal in frequency in pitch, no bruits, no rebound, no guarding, no midline pulsatile mass, no hepatomegaly, no splenomegaly EXT:  2 plus pulses throughout, no edema, no cyanosis no clubbing SKIN:  No rashes no nodules NEURO:  Cranial nerves II through XII grossly intact, motor grossly intact throughout PSYCH:  Cognitively intact, oriented to person place and time   ASSESSMENT/PLAN:    No problem-specific Assessment & Plan notes found for this encounter.   Screening for Secondary Hypertension: { Click here to document screening for secondary causes of HTN  :[id number]}    Relevant Labs/Studies:    Latest Ref Rng & Units 12/23/2022    8:14 AM 01/26/2022    9:34 AM 12/03/2021   11:19 AM  Basic Labs  Sodium 135 - 145 mEq/L 140  139  139   Potassium 3.5 - 5.1 mEq/L 5.1  4.2  4.0   Creatinine 0.40 - 1.50 mg/dL 6.57  8.46  9.62       Disposition:    FU with MD/PharmD in {gen number [id number]:[id number]} {Days to years:10300}    Medication Adjustments/Labs and Tests Ordered: Current medicines are reviewed at length with the patient today.  Concerns regarding medicines are outlined above.  No orders of the defined types were placed in this encounter.  No orders of the defined types were placed in this encounter.    Signed, Chilton Si, MD  01/19/2023 8:18 AM    [facility]

## 2023-01-19 NOTE — Patient Instructions (Addendum)
Medication Instructions:  START hydrochlorothiazide 25 MG DAILY    Labwork: RENIN/ALDOSTERONE/BMET IN 1 WEEK    Testing/Procedures: CALCIUM SCORE    Follow-Up: 2 MONTHS WITH PHARM D   4 MONTHS WITH DR Sully IN ADV HTN CLINIC    Special Instructions:  MONITOR BLOOD PRESSURE TWICE A DAY WITH MACHINE PROVIDED, MAKE SURE ARE LOGGED INTO YOUR VIVIFY APP    DASH Eating Plan DASH stands for "Dietary Approaches to Stop Hypertension." The DASH eating plan is a healthy eating plan that has been shown to reduce high blood pressure (hypertension). It may also reduce your risk for type 2 diabetes, heart disease, and stroke. The DASH eating plan may also help with weight loss. What are tips for following this plan?  General guidelines Avoid eating more than 2,300 mg (milligrams) of salt (sodium) a day. If you have hypertension, you may need to reduce your sodium intake to 1,500 mg a day. Limit alcohol intake to no more than 1 drink a day for nonpregnant women and 2 drinks a day for men. One drink equals 12 oz of beer, 5 oz of wine, or 1 oz of hard liquor. Work with your health care provider to maintain a healthy body weight or to lose weight. Ask what an ideal weight is for you. Get at least 30 minutes of exercise that causes your heart to beat faster (aerobic exercise) most days of the week. Activities may include walking, swimming, or biking. Work with your health care provider or diet and nutrition specialist (dietitian) to adjust your eating plan to your individual calorie needs. Reading food labels  Check food labels for the amount of sodium per serving. Choose foods with less than 5 percent of the Daily Value of sodium. Generally, foods with less than 300 mg of sodium per serving fit into this eating plan. To find whole grains, look for the word "whole" as the first word in the ingredient list. Shopping Buy products labeled as "low-sodium" or "no salt added." Buy fresh foods. Avoid  canned foods and premade or frozen meals. Cooking Avoid adding salt when cooking. Use salt-free seasonings or herbs instead of table salt or sea salt. Check with your health care provider or pharmacist before using salt substitutes. Do not fry foods. Cook foods using healthy methods such as baking, boiling, grilling, and broiling instead. Cook with heart-healthy oils, such as olive, canola, soybean, or sunflower oil. Meal planning Eat a balanced diet that includes: 5 or more servings of fruits and vegetables each day. At each meal, try to fill half of your plate with fruits and vegetables. Up to 6-8 servings of whole grains each day. Less than 6 oz of lean meat, poultry, or fish each day. A 3-oz serving of meat is about the same size as a deck of cards. One egg equals 1 oz. 2 servings of low-fat dairy each day. A serving of nuts, seeds, or beans 5 times each week. Heart-healthy fats. Healthy fats called Omega-3 fatty acids are found in foods such as flaxseeds and coldwater fish, like sardines, salmon, and mackerel. Limit how much you eat of the following: Canned or prepackaged foods. Food that is high in trans fat, such as fried foods. Food that is high in saturated fat, such as fatty meat. Sweets, desserts, sugary drinks, and other foods with added sugar. Full-fat dairy products. Do not salt foods before eating. Try to eat at least 2 vegetarian meals each week. Eat more home-cooked food and less restaurant, buffet, and  fast food. When eating at a restaurant, ask that your food be prepared with less salt or no salt, if possible. What foods are recommended? The items listed may not be a complete list. Talk with your dietitian about what dietary choices are best for you. Grains Whole-grain or whole-wheat bread. Whole-grain or whole-wheat pasta. Brown rice. Orpah Cobb. Bulgur. Whole-grain and low-sodium cereals. Pita bread. Low-fat, low-sodium crackers. Whole-wheat flour  tortillas. Vegetables Fresh or frozen vegetables (raw, steamed, roasted, or grilled). Low-sodium or reduced-sodium tomato and vegetable juice. Low-sodium or reduced-sodium tomato sauce and tomato paste. Low-sodium or reduced-sodium canned vegetables. Fruits All fresh, dried, or frozen fruit. Canned fruit in natural juice (without added sugar). Meat and other protein foods Skinless chicken or [place]. Ground chicken or [place]. Pork with fat trimmed off. Fish and seafood. Egg whites. Dried beans, peas, or lentils. Unsalted nuts, nut butters, and seeds. Unsalted canned beans. Lean cuts of beef with fat trimmed off. Low-sodium, lean deli meat. Dairy Low-fat (1%) or fat-free (skim) milk. Fat-free, low-fat, or reduced-fat cheeses. Nonfat, low-sodium ricotta or cottage cheese. Low-fat or nonfat yogurt. Low-fat, low-sodium cheese. Fats and oils Soft margarine without trans fats. Vegetable oil. Low-fat, reduced-fat, or light mayonnaise and salad dressings (reduced-sodium). Canola, safflower, olive, soybean, and sunflower oils. Avocado. Seasoning and other foods Herbs. Spices. Seasoning mixes without salt. Unsalted popcorn and pretzels. Fat-free sweets. What foods are not recommended? The items listed may not be a complete list. Talk with your dietitian about what dietary choices are best for you. Grains Baked goods made with fat, such as croissants, muffins, or some breads. Dry pasta or rice meal packs. Vegetables Creamed or fried vegetables. Vegetables in a cheese sauce. Regular canned vegetables (not low-sodium or reduced-sodium). Regular canned tomato sauce and paste (not low-sodium or reduced-sodium). Regular tomato and vegetable juice (not low-sodium or reduced-sodium). Rosita Fire. Olives. Fruits Canned fruit in a light or heavy syrup. Fried fruit. Fruit in cream or butter sauce. Meat and other protein foods Fatty cuts of meat. Ribs. Fried meat. Tomasa Blase. Sausage. Bologna and other processed lunch meats.  Salami. Fatback. Hotdogs. Bratwurst. Salted nuts and seeds. Canned beans with added salt. Canned or smoked fish. Whole eggs or egg yolks. Chicken or [place] with skin. Dairy Whole or 2% milk, cream, and half-and-half. Whole or full-fat cream cheese. Whole-fat or sweetened yogurt. Full-fat cheese. Nondairy creamers. Whipped toppings. Processed cheese and cheese spreads. Fats and oils Butter. Stick margarine. Lard. Shortening. Ghee. Bacon fat. Tropical oils, such as coconut, palm kernel, or palm oil. Seasoning and other foods Salted popcorn and pretzels. Onion salt, garlic salt, seasoned salt, table salt, and sea salt. Worcestershire sauce. Tartar sauce. Barbecue sauce. Teriyaki sauce. Soy sauce, including reduced-sodium. Steak sauce. Canned and packaged gravies. Fish sauce. Oyster sauce. Cocktail sauce. Horseradish that you find on the shelf. Ketchup. Mustard. Meat flavorings and tenderizers. Bouillon cubes. Hot sauce and Tabasco sauce. Premade or packaged marinades. Premade or packaged taco seasonings. Relishes. Regular salad dressings. Where to find more information: National Heart, Lung, and Blood Institute: PopSteam.is American Heart Association: www.heart.org Summary The DASH eating plan is a healthy eating plan that has been shown to reduce high blood pressure (hypertension). It may also reduce your risk for type 2 diabetes, heart disease, and stroke. With the DASH eating plan, you should limit salt (sodium) intake to 2,300 mg a day. If you have hypertension, you may need to reduce your sodium intake to 1,500 mg a day. When on the DASH eating plan, aim to eat  more fresh fruits and vegetables, whole grains, lean proteins, low-fat dairy, and heart-healthy fats. Work with your health care provider or diet and nutrition specialist (dietitian) to adjust your eating plan to your individual calorie needs. This information is not intended to replace advice given to you by your health care provider.  Make sure you discuss any questions you have with your health care provider. Document Released: 03/11/2011 Document Revised: 03/04/2017 Document Reviewed: 03/15/2016 Elsevier Patient Education  2020 [employer].

## 2023-01-20 ENCOUNTER — Telehealth: Payer: Self-pay

## 2023-01-20 DIAGNOSIS — Z0000 Encounter for general adult medical examination without abnormal findings: Secondary | ICD-10-CM

## 2023-01-20 NOTE — Telephone Encounter (Signed)
Called patient to conduct Vivify welcome call and offer health coaching to improve healthy eating habits per survey response. Patient did not answer. Left message for patient to return call.   Renaee Munda, MS, ERHD, [facility]  Care Guide, Health & Wellness Coach [address], [address] [place] [address] Telephone: [phone] Email: [email or web address]

## 2023-01-27 ENCOUNTER — Telehealth: Payer: Self-pay | Admitting: Family Medicine

## 2023-01-27 NOTE — Telephone Encounter (Signed)
Pt want to know if you can approve his knee replacement. Or what do he have to do for you to authorize this. Please call the pt

## 2023-01-28 ENCOUNTER — Encounter: Payer: Self-pay | Admitting: Family Medicine

## 2023-01-31 LAB — BASIC METABOLIC PANEL
BUN/Creatinine Ratio: 18 (ref 10–24)
BUN: 16 mg/dL (ref 8–27)
CO2: 22 mmol/L (ref 20–29)
Calcium: 9.7 mg/dL (ref 8.6–10.2)
Chloride: 101 mmol/L (ref 96–106)
Creatinine, Ser: 0.89 mg/dL (ref 0.76–1.27)
Glucose: 102 mg/dL — ABNORMAL HIGH (ref 70–99)
Potassium: 5.3 mmol/L — ABNORMAL HIGH (ref 3.5–5.2)
Sodium: 138 mmol/L (ref 134–144)
eGFR: 95 mL/min/{1.73_m2} (ref >59)

## 2023-01-31 LAB — ALDOSTERONE + RENIN ACTIVITY W/ RATIO
Aldos/Renin Ratio: 2.6 (ref 0.0–30.0)
Aldosterone: 6.7 ng/dL (ref 0.0–30.0)
Renin Activity, Plasma: 2.596 ng/mL/h (ref 0.167–5.380)

## 2023-01-31 LAB — TSH: TSH: 1.67 u[IU]/mL (ref 0.450–4.500)

## 2023-02-01 ENCOUNTER — Telehealth (HOSPITAL_BASED_OUTPATIENT_CLINIC_OR_DEPARTMENT_OTHER): Payer: Self-pay

## 2023-02-01 ENCOUNTER — Telehealth: Payer: Self-pay | Admitting: Cardiovascular Disease

## 2023-02-01 DIAGNOSIS — I10 Essential (primary) hypertension: Secondary | ICD-10-CM

## 2023-02-01 MED ORDER — AMLODIPINE BESYLATE 5 MG PO TABS
5.0000 mg | ORAL_TABLET | Freq: Every day | ORAL | 1 refills | Status: DC
Start: 2023-02-01 — End: 2023-02-16

## 2023-02-01 NOTE — Addendum Note (Signed)
Addended by: Marlene Lard on: 02/01/2023 09:18 AM   Modules accepted: Orders

## 2023-02-01 NOTE — Addendum Note (Signed)
Addended by: Marlene Lard on: 02/01/2023 09:04 AM   Modules accepted: Orders

## 2023-02-01 NOTE — Telephone Encounter (Addendum)
Will call 10/29   ----- Message from [facility] sent at 02/01/2023 12:07 AM EDT ----- BP remains above goal in Vivify.  Please have him add amlodipine 5mg  daily.  If this works well will plan to switch him to Tribenzor to limit pill burden.  Thanks, [employer]

## 2023-02-01 NOTE — Telephone Encounter (Signed)
Called patient at request of MD, patient agreeable to new medication, sent to preferred pharmacy.

## 2023-02-07 ENCOUNTER — Ambulatory Visit (HOSPITAL_BASED_OUTPATIENT_CLINIC_OR_DEPARTMENT_OTHER)
Admission: RE | Admit: 2023-02-07 | Discharge: 2023-02-07 | Disposition: A | Discharge status: Home / Self Care | Payer: No Typology Code available for payment source | Source: Ambulatory Visit | Attending: Cardiovascular Disease | Admitting: Cardiovascular Disease

## 2023-02-07 DIAGNOSIS — I10 Essential (primary) hypertension: Secondary | ICD-10-CM | POA: Insufficient documentation

## 2023-02-16 ENCOUNTER — Ambulatory Visit: Payer: No Typology Code available for payment source | Admitting: Internal Medicine

## 2023-02-16 ENCOUNTER — Encounter (HOSPITAL_BASED_OUTPATIENT_CLINIC_OR_DEPARTMENT_OTHER): Payer: Self-pay | Admitting: Cardiovascular Disease

## 2023-02-16 DIAGNOSIS — I10 Essential (primary) hypertension: Secondary | ICD-10-CM

## 2023-02-16 DIAGNOSIS — E785 Hyperlipidemia, unspecified: Secondary | ICD-10-CM

## 2023-02-16 DIAGNOSIS — Z5181 Encounter for therapeutic drug level monitoring: Secondary | ICD-10-CM

## 2023-02-16 MED ORDER — OLMESARTAN-AMLODIPINE-HCTZ 40-5-25 MG PO TABS: 1.0000 | ORAL_TABLET | Freq: Every day | ORAL | 0 refills | Status: DC

## 2023-02-16 MED ORDER — ROSUVASTATIN CALCIUM 20 MG PO TABS: 20.0000 mg | ORAL_TABLET | Freq: Every day | ORAL | 3 refills | Status: DC

## 2023-02-16 NOTE — Telephone Encounter (Signed)
Tribenzor 40-5-25. Stop Irbesratan, Amlodipine, hydrochlorothiazide.   Per Dr. Duke Salvia last note, "Discontinue chronic NSAID use, replace with Tylenol as needed." May take up to 4,000mg  tylenol per day. If has not tried Tylenol 1,000mg  dose would start there. Further pain medication recomenndations will need to come from PCP or orthopedics. Recommend reaching out to PCP team about sleep.  Alver Sorrow, NP

## 2023-02-16 NOTE — Telephone Encounter (Signed)
Patient had an additional question regarding whether or not he will need to continue taking amlodipine and hydrochlorothiazide now that BPs are controlled? Pt stated he was informed at last visit that he would be prescribed a combination medication instead.   He will see an APP Monday 11/18 for his pre-op clearance. Pt verbalized understanding of changes and will be awaiting response on med changes. No further questions at this time.

## 2023-02-16 NOTE — Progress Notes (Deleted)
  Cardiology Office Note:  .   Date:  02/16/2023  ID:  Jacob Rodriguez, DOB 1955/10/17, MRN [id number] PCP: Mliss Sax, MD  [facility] Providers Cardiologist:  None { Click to update primary MD,subspecialty MD or APP then REFRESH:1}   History of Present Illness: .   Jacob Rodriguez is a 67 y.o. male with history of hypertension he actually sees Dr. Duke Salvia in clinic; unclear why he would be sent here for a preop eval.  ROS: ***  Studies Reviewed: .        *** Risk Assessment/Calculations:   {Does this patient have ATRIAL FIBRILLATION?[phone]} No BP recorded.  {Refresh Note OR Click here to enter BP  :1}***       Physical Exam:   VS:  There were no vitals taken for this visit.   Wt Readings from Last 3 Encounters:  01/19/23 227 lb 3.2 oz (103.1 kg)  12/16/22 223 lb (101.2 kg)  02/05/22 216 lb (98 kg)    GEN: Well nourished, well developed in no acute distress NECK: No JVD; No carotid bruits CARDIAC: ***RRR, no murmurs, rubs, gallops RESPIRATORY:  Clear to auscultation without rales, wheezing or rhonchi  ABDOMEN: Soft, non-tender, non-distended EXTREMITIES:  No edema; No deformity   ASSESSMENT AND PLAN: .   ***    {Are you ordering a CV Procedure (e.g. stress test, cath, DCCV, TEE, etc)?   Press F2        :[id number]}  Dispo: ***  Signed, Maisie Fus, MD

## 2023-02-17 ENCOUNTER — Telehealth (HOSPITAL_BASED_OUTPATIENT_CLINIC_OR_DEPARTMENT_OTHER): Payer: Self-pay | Admitting: Cardiovascular Disease

## 2023-02-17 ENCOUNTER — Encounter (HOSPITAL_BASED_OUTPATIENT_CLINIC_OR_DEPARTMENT_OTHER): Payer: Self-pay | Admitting: Cardiovascular Disease

## 2023-02-17 ENCOUNTER — Institutional Professional Consult (permissible substitution) (HOSPITAL_BASED_OUTPATIENT_CLINIC_OR_DEPARTMENT_OTHER): Payer: No Typology Code available for payment source | Admitting: Family

## 2023-02-17 NOTE — Telephone Encounter (Signed)
Addressed via phone call 02/17/23. TY!  Alver Sorrow, NP

## 2023-02-17 NOTE — Telephone Encounter (Signed)
Called patient about medication clarification. He is to stop Irebesartan, amlodipine and hydrochlorothiazide once he receives combo pill. Crestor is another medication for his HLD. Patient verbalized understanding.

## 2023-02-17 NOTE — Telephone Encounter (Signed)
Patient stopped by due to confusion re his appointments and has questions about his medications. Patient is now clear about appointments but is confused about which medications to stop taking. He sent a MyChart msg already and this is a follow up to that message.

## 2023-02-21 ENCOUNTER — Ambulatory Visit: Payer: No Typology Code available for payment source | Admitting: Physician Assistant

## 2023-02-21 ENCOUNTER — Ambulatory Visit: Payer: No Typology Code available for payment source | Attending: Physician Assistant | Admitting: Emergency Medicine

## 2023-02-21 ENCOUNTER — Encounter: Payer: Self-pay | Admitting: Physician Assistant

## 2023-02-21 VITALS — BP 126/70 | HR 60 | Ht 73.0 in | Wt 226.0 lb

## 2023-02-21 DIAGNOSIS — E785 Hyperlipidemia, unspecified: Secondary | ICD-10-CM | POA: Diagnosis not present

## 2023-02-21 DIAGNOSIS — I77810 Thoracic aortic ectasia: Secondary | ICD-10-CM

## 2023-02-21 DIAGNOSIS — I10 Essential (primary) hypertension: Secondary | ICD-10-CM

## 2023-02-21 DIAGNOSIS — Z01810 Encounter for preprocedural cardiovascular examination: Secondary | ICD-10-CM

## 2023-02-21 DIAGNOSIS — I2510 Atherosclerotic heart disease of native coronary artery without angina pectoris: Secondary | ICD-10-CM

## 2023-02-21 NOTE — Progress Notes (Signed)
Cardiology Office Note:    Date:  02/21/2023  ID:  Jacob Rodriguez, DOB Feb 09, 1956, MRN [id number] PCP: Mliss Sax, MD  [facility] Providers Cardiologist:  Chilton Si, MD       Patient Profile:      67 year old male with history of hypertension and establish care with advanced hypertension clinic on 01/19/2023.  He has had high blood pressure for about 1 and half years that has been gradually increasing.  His blood pressure was noted to be 177/94 and 186/88 on his initial visit.  He underwent right knee replacement on 02/05/2022 where his blood pressure was noted to be high.  Currently his left total knee arthroplasty is pending improvement in his blood pressures.  Alcohol consumption up to 5 times a week.  For pain management he takes Advil 100 g twice a week when he plays golf.  He will occasionally take Advil PM tingling at night helps him sleep.  He was started on hydrochlorothiazide 25mg .  Coronary calcium score on 02/07/2023 showing score of 655 which is 85 percentile.  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      History of Present Illness:   Jacob Rodriguez is a 67 y.o. male who returns for follow-up hypertension and preoperative clearance.  Today, patient notes that he is doing well and well.  He he plans to have left knee surgery by the end of this year and would like preoperative clearance.  He notes compliance with his medication regimen.  He continues to exercise and golf.  He is taking his blood pressure twice daily with average blood pressures in the 120s to 130s.  He tells me that he has stopped taking Advil daily for his pain.  He attributes his decrease in NSAIDs to his better blood pressure control.  He has tried Tylenol without relief of pain.  He denies any exertional chest pain, shortness of breath, syncope.          Review of Systems  Constitutional: Negative for weight gain and weight loss.  Cardiovascular:  Negative for chest pain,  claudication, cyanosis, dyspnea on exertion, irregular heartbeat, leg swelling, near-syncope, orthopnea, palpitations, paroxysmal nocturnal dyspnea and syncope.  Respiratory:  Negative for cough, hemoptysis and shortness of breath.   Gastrointestinal:  Negative for abdominal pain, hematochezia and melena.  Genitourinary:  Negative for hematuria.     See HPI     Studies Reviewed:   EKG Interpretation Date/Time:  Monday February 21 2023 09:00:43 EST Ventricular Rate:  60 PR Interval:  172 QRS Duration:  106 QT Interval:  406 QTC Calculation: 406 R Axis:   66  Text Interpretation: Normal sinus rhythm Normal ECG Confirmed by Rise Paganini [phone]) on 02/21/2023 9:14:29 AM    CT Cardiac Scoring 02/07/2023 1. Coronary calcium score of 655. This was 85th percentile for age-, race-, and sex-matched controls.   2. Moderately dilated ascending aorta up to 43 mm.  Risk Assessment/Calculations:             Physical Exam:   VS:  BP 126/70 (BP Location: Left Arm, Patient Position: Sitting, Cuff Size: Normal)   Pulse 60   Ht 6\' 1"  (1.854 m)   Wt 226 lb (102.5 kg)   BMI 29.82 kg/m    Wt Readings from Last 3 Encounters:  02/21/23 226 lb (102.5 kg)  01/19/23 227 lb 3.2 oz (103.1 kg)  12/16/22 223 lb (101.2 kg)    Constitutional:      Appearance: Normal  and healthy appearance.  Neck:     Vascular: JVD normal.  Pulmonary:     Effort: Pulmonary effort is normal.     Breath sounds: Normal breath sounds.  Chest:     Chest wall: Not tender to palpatation.  Cardiovascular:     PMI at left midclavicular line. Normal rate. Regular rhythm. Normal S1. Normal S2.      Murmurs: There is no murmur.     No gallop.  No click. No rub.  Pulses:    Intact distal pulses.  Edema:    Peripheral edema absent.  Musculoskeletal: Normal range of motion.     Cervical back: Normal range of motion and neck supple. Skin:    General: Skin is warm and dry.  Neurological:     General: No focal deficit  present.     Mental Status: Alert and oriented to person, place and time.  Psychiatric:        Behavior: Behavior is cooperative.        Assessment and Plan:  Hypertension -BP today 126/70, much improved since stopping daily advil  -Well controlled, est w/ HTN clinic, enrolled in remote pt monitoring program  -Encouraged continued daily exercise and heart healthy dieting  -Continue olmesartan-amlodipine-HCTZ 40-5-25mg  once daily, Coreg CR 20 mg once daily  Dilated ascending aorta -Coronary calcium score 02/07/2023 showing moderately dilated ascending aorta at 43mm -Plan for echocardiogram x 1 year -Tight blood pressure control  Coronary artery disease / hyperlipidemia, goal < 70 -02/07/2023 calcium score 655, 85th percentile -LDL 113 on 12/23/2022, repeat lipid panel x2-3 months, recently started on rosuvastatin on 02/09/2023 -No exertional angina, no indication for ischemic evaluation -Continue rosuvastatin 20 mg once daily -Consider addition of ASA 81 mg after upcoming left knee surgery  Preoperative clearance According to the Revised Cardiac Risk Index (RCRI), his Perioperative Risk of Major Cardiac Event is (%): 0.9. His Functional Capacity in METs is: 7.99 according to the Duke Activity Status Index (DASI). Therefore, based on ACC/AHA guidelines, patient would be at acceptable risk for the planned procedure without further cardiovascular testing. I will route this recommendation to the requesting party via Epic fax function.                  Dispo: Follow-up in 1 month with Gillian Shields, NP  Signed, Denyce Robert, NP

## 2023-02-21 NOTE — Patient Instructions (Signed)
Medication Instructions:  NO CHANGES *If you need a refill on your cardiac medications before your next appointment, please call your pharmacy*   Lab Work: NO LAB If you have labs (blood work) drawn today and your tests are completely normal, you will receive your results only by: MyChart Message (if you have MyChart) OR A paper copy in the mail If you have any lab test that is abnormal or we need to change your treatment, we will call you to review the results.   Testing/Procedures: NO TESTING   Follow-Up: At [facility], you and your health needs are our priority.  As part of our continuing mission to provide you with exceptional heart care, we have created designated Provider Care Teams.  These Care Teams include your primary Cardiologist (physician) and Advanced Practice Providers (APPs -  Physician Assistants and Nurse Practitioners) who all work together to provide you with the care you need, when you need it.     Your next appointment:   KEEP FOLLOW UP March 21 2023  Provider:   Gillian Shields, NP

## 2023-02-24 ENCOUNTER — Encounter: Payer: Self-pay | Admitting: Family Medicine

## 2023-03-02 ENCOUNTER — Ambulatory Visit: Payer: Self-pay | Admitting: Emergency Medicine

## 2023-03-02 DIAGNOSIS — G8929 Other chronic pain: Secondary | ICD-10-CM

## 2023-03-02 NOTE — Progress Notes (Signed)
Surgery orders requested via Epic inbox. °

## 2023-03-02 NOTE — H&P (View-Only) (Signed)
 TOTAL KNEE ADMISSION H&P  Patient is being admitted for left total knee arthroplasty.  Subjective:  Chief Complaint:left knee pain.  HPI: Jacob Rodriguez, 66 y.o. male, has a history of pain and functional disability in the left knee due to arthritis and has failed non-surgical conservative treatments for greater than 12 weeks to includeNSAID's and/or analgesics, corticosteriod injections, and activity modification.  Onset of symptoms was gradual, starting 5 years ago with gradually worsening course since that time. The patient noted prior procedures on the knee to include  arthroscopy on the left knee(s).  Patient currently rates pain in the left knee(s) at 8 out of 10 with activity. Patient has night pain, worsening of pain with activity and weight bearing, pain that interferes with activities of daily living, and pain with passive range of motion.  Patient has evidence of periarticular osteophytes and joint space narrowing by imaging studies.  There is no active infection.  Patient Active Problem List   Diagnosis Date Noted   Primary hypertension 01/19/2023   Erectile dysfunction due to arterial insufficiency 12/03/2021   Libido, decreased 12/03/2021   Seasonal allergic rhinitis due to pollen 12/22/2020   Vitamin D deficiency 06/08/2019   Anemia 06/08/2019   Healthcare maintenance 06/08/2019   Adjustment insomnia 03/12/2019   Orchitis 03/12/2019   Hair loss 03/12/2019   Herpes labialis 03/12/2019   Need for influenza vaccination 03/12/2019   Bite, snake 08/24/2018   Cellulitis of toe of left foot 08/24/2018   White coat syndrome with diagnosis of hypertension 04/04/2018   Right distal insertional biceps tendinosis 03/01/2016   Past Medical History:  Diagnosis Date   Allergy    as a child   Arthritis    knees   Hypertension    Primary hypertension 01/19/2023    Past Surgical History:  Procedure Laterality Date   BICEPS TENDON REPAIR Right 2018   COLONOSCOPY     HYDROCELE  EXCISION  01/09/1993   KNEE ARTHROSCOPY     rt x3   KNEE ARTHROSCOPY     x2 on left   POLYPECTOMY     ROTATOR CUFF REPAIR Right 1987   TOTAL KNEE ARTHROPLASTY Right 02/05/2022   Procedure: TOTAL KNEE ARTHROPLASTY;  Surgeon: Joen Laura, MD;  Location: [facility];  Service: Orthopedics;  Laterality: Right;   TREATMENT FISTULA ANAL  1993    Current Outpatient Medications  Medication Sig Dispense Refill Last Dose   carvedilol (COREG CR) 20 MG 24 hr capsule TAKE 1 CAPSULE AT BEDTIME 90 capsule 0    fluticasone (CUTIVATE) 0.05 % cream Apply 1 Application topically 2 (two) times daily as needed (irritation).      glucosamine-chondroitin 500-400 MG tablet Take 1 tablet by mouth daily.      Multiple Vitamin (MULTIVITAMIN) tablet Take 1 tablet by mouth daily.      Olmesartan-amLODIPine-HCTZ 40-5-25 MG TABS Take 1 tablet by mouth daily. 90 tablet 0    PROPECIA 1 MG tablet TAKE 1 TABLET(1 MG) BY MOUTH DAILY (Patient taking differently: Take 0.25 mg by mouth daily.) 30 tablet 3    rosuvastatin (CRESTOR) 20 MG tablet Take 1 tablet (20 mg total) by mouth daily. 90 tablet 3    sildenafil (REVATIO) 20 MG tablet May take 3 to 5 tablets 45 minutes prior with no more than 5 tablets daily. 25 tablet 0    TURMERIC PO Take 1 tablet by mouth daily.      valACYclovir (VALTREX) 1000 MG tablet TAKE 1/2 TABLET(500 MG) BY MOUTH  TWICE DAILY FOR 3 DAYS AS NEEDED FOR OUTBREAK 10 tablet 0    VITAMIN D PO Take 1 tablet by mouth daily.      zolpidem (AMBIEN CR) 6.25 MG CR tablet Take 1 tablet (6.25 mg total) by mouth at bedtime as needed for sleep. 20 tablet 0    No current facility-administered medications for this visit.   No Known Allergies  Social History   Tobacco Use   Smoking status: Never   Smokeless tobacco: Never  Substance Use Topics   Alcohol use: Yes    Alcohol/week: 7.0 standard drinks of alcohol    Types: 7 Standard drinks or equivalent per week    Comment: 4 times a week couple drinks     Family History  Problem Relation Age of Onset   Colon cancer Neg Hx    Diabetes Neg Hx    Esophageal cancer Neg Hx    Rectal cancer Neg Hx    Stomach cancer Neg Hx      Review of Systems  Musculoskeletal:  Positive for arthralgias.  All other systems reviewed and are negative.   Objective:  Physical Exam Constitutional:      General: He is not in acute distress.    Appearance: Normal appearance. He is normal weight.  HENT:     Head: Normocephalic and atraumatic.  Eyes:     Extraocular Movements: Extraocular movements intact.     Conjunctiva/sclera: Conjunctivae normal.     Pupils: Pupils are equal, round, and reactive to light.  Cardiovascular:     Rate and Rhythm: Normal rate and regular rhythm.     Pulses: Normal pulses.     Heart sounds: Normal heart sounds.  Pulmonary:     Effort: Pulmonary effort is normal. No respiratory distress.     Breath sounds: Normal breath sounds.  Abdominal:     General: Bowel sounds are normal. There is no distension.     Palpations: Abdomen is soft.     Tenderness: There is no abdominal tenderness.  Musculoskeletal:        General: Tenderness present.     Cervical back: Normal range of motion and neck supple.     Comments: TTP over medial and lateral joint line, medial worse than lateral.  No calf tenderness, swelling, or erythema.  No overlying lesions of area of chief complaint.  Decreased strength and ROM due to elicited pain.  Dorsiflexion and plantarflexion intact.  Stable to varus and valgus stress.  BLE appear grossly neurovascularly intact.  Gait mildly antalgic.   Lymphadenopathy:     Cervical: No cervical adenopathy.  Skin:    General: Skin is warm and dry.     Capillary Refill: Capillary refill takes less than 2 seconds.     Findings: No erythema or rash.  Neurological:     General: No focal deficit present.     Mental Status: He is alert and oriented to person, place, and time.  Psychiatric:        Mood and Affect:  Mood normal.        Behavior: Behavior normal.     Vital signs in last 24 hours: @VSRANGES @  Labs:   Estimated body mass index is 29.82 kg/m as calculated from the following:   Height as of 02/21/23: 6\' 1"  (1.854 m).   Weight as of 02/21/23: 102.5 kg.   Imaging Review Plain radiographs demonstrate severe degenerative joint disease of the left knee(s). The overall alignment issignificant varus. The bone quality appears  to be fair for age and reported activity level.      Assessment/Plan:  End stage arthritis, left knee   The patient history, physical examination, clinical judgment of the provider and imaging studies are consistent with end stage degenerative joint disease of the left knee(s) and total knee arthroplasty is deemed medically necessary. The treatment options including medical management, injection therapy arthroscopy and arthroplasty were discussed at length. The risks and benefits of total knee arthroplasty were presented and reviewed. The risks due to aseptic loosening, infection, stiffness, patella tracking problems, thromboembolic complications and other imponderables were discussed. The patient acknowledged the explanation, agreed to proceed with the plan and consent was signed. Patient is being admitted for inpatient treatment for surgery, pain control, PT, OT, prophylactic antibiotics, VTE prophylaxis, progressive ambulation and ADL's and discharge planning. The patient is planning to be discharged home with outpatient PT.     Patient's anticipated LOS is less than 2 midnights, meeting these requirements: - Younger than 68 - Lives within 1 hour of care - Has a competent adult at home to recover with post-op recover - NO history of  - Chronic pain requiring opiods  - Diabetes  - Coronary Artery Disease  - Heart failure  - Heart attack  - Stroke  - DVT/VTE  - Cardiac arrhythmia  - Respiratory Failure/COPD  - Renal failure  - Anemia  - Advanced Liver  disease

## 2023-03-02 NOTE — H&P (Signed)
TOTAL KNEE ADMISSION H&P  Patient is being admitted for left total knee arthroplasty.  Subjective:  Chief Complaint:left knee pain.  HPI: Jacob BRUER, 67 y.o. male, has a history of pain and functional disability in the left knee due to arthritis and has failed non-surgical conservative treatments for greater than 12 weeks to includeNSAID's and/or analgesics, corticosteriod injections, and activity modification.  Onset of symptoms was gradual, starting 5 years ago with gradually worsening course since that time. The patient noted prior procedures on the knee to include  arthroscopy on the left knee(s).  Patient currently rates pain in the left knee(s) at 8 out of 10 with activity. Patient has night pain, worsening of pain with activity and weight bearing, pain that interferes with activities of daily living, and pain with passive range of motion.  Patient has evidence of periarticular osteophytes and joint space narrowing by imaging studies.  There is no active infection.  Patient Active Problem List   Diagnosis Date Noted   Primary hypertension 01/19/2023   Erectile dysfunction due to arterial insufficiency 12/03/2021   Libido, decreased 12/03/2021   Seasonal allergic rhinitis due to pollen 12/22/2020   Vitamin D deficiency 06/08/2019   Anemia 06/08/2019   Healthcare maintenance 06/08/2019   Adjustment insomnia 03/12/2019   Orchitis 03/12/2019   Hair loss 03/12/2019   Herpes labialis 03/12/2019   Need for influenza vaccination 03/12/2019   Bite, snake 08/24/2018   Cellulitis of toe of left foot 08/24/2018   White coat syndrome with diagnosis of hypertension 04/04/2018   Right distal insertional biceps tendinosis 03/01/2016   Past Medical History:  Diagnosis Date   Allergy    as a child   Arthritis    knees   Hypertension    Primary hypertension 01/19/2023    Past Surgical History:  Procedure Laterality Date   BICEPS TENDON REPAIR Right 2018   COLONOSCOPY     HYDROCELE  EXCISION  01/09/1993   KNEE ARTHROSCOPY     rt x3   KNEE ARTHROSCOPY     x2 on left   POLYPECTOMY     ROTATOR CUFF REPAIR Right 1987   TOTAL KNEE ARTHROPLASTY Right 02/05/2022   Procedure: TOTAL KNEE ARTHROPLASTY;  Surgeon: Joen Laura, MD;  Location: [facility];  Service: Orthopedics;  Laterality: Right;   TREATMENT FISTULA ANAL  1993    Current Outpatient Medications  Medication Sig Dispense Refill Last Dose   carvedilol (COREG CR) 20 MG 24 hr capsule TAKE 1 CAPSULE AT BEDTIME 90 capsule 0    fluticasone (CUTIVATE) 0.05 % cream Apply 1 Application topically 2 (two) times daily as needed (irritation).      glucosamine-chondroitin 500-400 MG tablet Take 1 tablet by mouth daily.      Multiple Vitamin (MULTIVITAMIN) tablet Take 1 tablet by mouth daily.      Olmesartan-amLODIPine-HCTZ 40-5-25 MG TABS Take 1 tablet by mouth daily. 90 tablet 0    PROPECIA 1 MG tablet TAKE 1 TABLET(1 MG) BY MOUTH DAILY (Patient taking differently: Take 0.25 mg by mouth daily.) 30 tablet 3    rosuvastatin (CRESTOR) 20 MG tablet Take 1 tablet (20 mg total) by mouth daily. 90 tablet 3    sildenafil (REVATIO) 20 MG tablet May take 3 to 5 tablets 45 minutes prior with no more than 5 tablets daily. 25 tablet 0    TURMERIC PO Take 1 tablet by mouth daily.      valACYclovir (VALTREX) 1000 MG tablet TAKE 1/2 TABLET(500 MG) BY MOUTH  TWICE DAILY FOR 3 DAYS AS NEEDED FOR OUTBREAK 10 tablet 0    VITAMIN D PO Take 1 tablet by mouth daily.      zolpidem (AMBIEN CR) 6.25 MG CR tablet Take 1 tablet (6.25 mg total) by mouth at bedtime as needed for sleep. 20 tablet 0    No current facility-administered medications for this visit.   No Known Allergies  Social History   Tobacco Use   Smoking status: Never   Smokeless tobacco: Never  Substance Use Topics   Alcohol use: Yes    Alcohol/week: 7.0 standard drinks of alcohol    Types: 7 Standard drinks or equivalent per week    Comment: 4 times a week couple drinks     Family History  Problem Relation Age of Onset   Colon cancer Neg Hx    Diabetes Neg Hx    Esophageal cancer Neg Hx    Rectal cancer Neg Hx    Stomach cancer Neg Hx      Review of Systems  Musculoskeletal:  Positive for arthralgias.  All other systems reviewed and are negative.   Objective:  Physical Exam Constitutional:      General: He is not in acute distress.    Appearance: Normal appearance. He is normal weight.  HENT:     Head: Normocephalic and atraumatic.  Eyes:     Extraocular Movements: Extraocular movements intact.     Conjunctiva/sclera: Conjunctivae normal.     Pupils: Pupils are equal, round, and reactive to light.  Cardiovascular:     Rate and Rhythm: Normal rate and regular rhythm.     Pulses: Normal pulses.     Heart sounds: Normal heart sounds.  Pulmonary:     Effort: Pulmonary effort is normal. No respiratory distress.     Breath sounds: Normal breath sounds.  Abdominal:     General: Bowel sounds are normal. There is no distension.     Palpations: Abdomen is soft.     Tenderness: There is no abdominal tenderness.  Musculoskeletal:        General: Tenderness present.     Cervical back: Normal range of motion and neck supple.     Comments: TTP over medial and lateral joint line, medial worse than lateral.  No calf tenderness, swelling, or erythema.  No overlying lesions of area of chief complaint.  Decreased strength and ROM due to elicited pain.  Dorsiflexion and plantarflexion intact.  Stable to varus and valgus stress.  BLE appear grossly neurovascularly intact.  Gait mildly antalgic.   Lymphadenopathy:     Cervical: No cervical adenopathy.  Skin:    General: Skin is warm and dry.     Capillary Refill: Capillary refill takes less than 2 seconds.     Findings: No erythema or rash.  Neurological:     General: No focal deficit present.     Mental Status: He is alert and oriented to person, place, and time.  Psychiatric:        Mood and Affect:  Mood normal.        Behavior: Behavior normal.     Vital signs in last 24 hours: @VSRANGES @  Labs:   Estimated body mass index is 29.82 kg/m as calculated from the following:   Height as of 02/21/23: 6\' 1"  (1.854 m).   Weight as of 02/21/23: 102.5 kg.   Imaging Review Plain radiographs demonstrate severe degenerative joint disease of the left knee(s). The overall alignment issignificant varus. The bone quality appears  to be fair for age and reported activity level.      Assessment/Plan:  End stage arthritis, left knee   The patient history, physical examination, clinical judgment of the provider and imaging studies are consistent with end stage degenerative joint disease of the left knee(s) and total knee arthroplasty is deemed medically necessary. The treatment options including medical management, injection therapy arthroscopy and arthroplasty were discussed at length. The risks and benefits of total knee arthroplasty were presented and reviewed. The risks due to aseptic loosening, infection, stiffness, patella tracking problems, thromboembolic complications and other imponderables were discussed. The patient acknowledged the explanation, agreed to proceed with the plan and consent was signed. Patient is being admitted for inpatient treatment for surgery, pain control, PT, OT, prophylactic antibiotics, VTE prophylaxis, progressive ambulation and ADL's and discharge planning. The patient is planning to be discharged home with outpatient PT.     Patient's anticipated LOS is less than 2 midnights, meeting these requirements: - Younger than 68 - Lives within 1 hour of care - Has a competent adult at home to recover with post-op recover - NO history of  - Chronic pain requiring opiods  - Diabetes  - Coronary Artery Disease  - Heart failure  - Heart attack  - Stroke  - DVT/VTE  - Cardiac arrhythmia  - Respiratory Failure/COPD  - Renal failure  - Anemia  - Advanced Liver  disease

## 2023-03-06 ENCOUNTER — Other Ambulatory Visit (INDEPENDENT_AMBULATORY_CARE_PROVIDER_SITE_OTHER): Payer: Self-pay

## 2023-03-10 NOTE — Progress Notes (Addendum)
COVID Vaccine received:  []  No [x]  Yes Date of any COVID positive Test in last 90 days: no PCP - Nadene Rubins MD Cardiologist - Chilton Si MD  Chest x-ray -  EKG -  02/21/23 Epic Stress Test -  ECHO -  Cardiac Cath -   Cardiac clearance Rise Paganini NP- 02/21/23  Bowel Prep - [x]  No  []   Yes ______  Pacemaker / ICD device [x]  No []  Yes   Spinal Cord Stimulator:[x]  No []  Yes       History of Sleep Apnea? [x]  No []  Yes   CPAP used?- [x]  No []  Yes    Does the patient monitor blood sugar?          [x]  No []  Yes  []  N/A  Patient has: [x]  NO Hx DM   []  Pre-DM                 []  DM1  []   DM2 Does patient have a [employer] or Dexacom? []  No []  Yes   Fasting Blood Sugar Ranges-  Checks Blood Sugar _____ times a day  GLP1 agonist / usual dose - no GLP1 instructions:  SGLT-2 inhibitors / usual dose - no SGLT-2 instructions:   Blood Thinner / Instructions: Aspirin Instructions:  Comments:   Activity level: Patient is able  to climb a flight of stairs without difficulty; [x]  No CP  [x]  No SOB,  ___   Patient can  perform ADLs without assistance.   Anesthesia review:   Patient denies shortness of breath, fever, cough and chest pain at PAT appointment.  Patient verbalized understanding and agreement to the Pre-Surgical Instructions that were given to them at this PAT appointment. Patient was also educated of the need to review these PAT instructions again prior to his/her surgery.I reviewed the appropriate phone numbers to call if they have any and questions or concerns.

## 2023-03-11 ENCOUNTER — Encounter (HOSPITAL_COMMUNITY): Payer: Self-pay

## 2023-03-11 ENCOUNTER — Encounter (HOSPITAL_COMMUNITY)
Admission: RE | Admit: 2023-03-11 | Discharge: 2023-03-11 | Disposition: A | Discharge status: Home / Self Care | Payer: No Typology Code available for payment source | Source: Ambulatory Visit | Attending: Orthopedic Surgery | Admitting: Orthopedic Surgery

## 2023-03-11 ENCOUNTER — Other Ambulatory Visit: Payer: Self-pay

## 2023-03-11 VITALS — BP 137/91 | HR 80 | Temp 98.4°F | Resp 16 | Ht 73.0 in | Wt 215.0 lb

## 2023-03-11 DIAGNOSIS — G8929 Other chronic pain: Secondary | ICD-10-CM

## 2023-03-11 DIAGNOSIS — M25562 Pain in left knee: Secondary | ICD-10-CM | POA: Insufficient documentation

## 2023-03-11 DIAGNOSIS — Z01818 Encounter for other preprocedural examination: Secondary | ICD-10-CM | POA: Diagnosis present

## 2023-03-11 HISTORY — DX: Other complications of anesthesia, initial encounter: T88.59XA

## 2023-03-11 LAB — COMPREHENSIVE METABOLIC PANEL
ALT: 22 U/L (ref 0–44)
AST: 20 U/L (ref 15–41)
Albumin: 4.3 g/dL (ref 3.5–5.0)
Alkaline Phosphatase: 60 U/L (ref 38–126)
Anion gap: 8 (ref 5–15)
BUN: 16 mg/dL (ref 8–23)
CO2: 23 mmol/L (ref 22–32)
Calcium: 9.1 mg/dL (ref 8.9–10.3)
Chloride: 108 mmol/L (ref 98–111)
Creatinine, Ser: 0.72 mg/dL (ref 0.61–1.24)
GFR, Estimated: >60 mL/min (ref >60)
Glucose, Bld: 106 mg/dL — ABNORMAL HIGH (ref 70–99)
Potassium: 4.9 mmol/L (ref 3.5–5.1)
Sodium: 139 mmol/L (ref 135–145)
Total Bilirubin: 1.2 mg/dL — ABNORMAL HIGH (ref <1.2)
Total Protein: 7.1 g/dL (ref 6.5–8.1)

## 2023-03-11 LAB — SURGICAL PCR SCREEN
MRSA, PCR: NEGATIVE
Staphylococcus aureus: NEGATIVE

## 2023-03-11 LAB — CBC WITH DIFFERENTIAL/PLATELET
Abs Immature Granulocytes: 0.02 10*3/uL (ref 0.00–0.07)
Basophils Absolute: 0.1 10*3/uL (ref 0.0–0.1)
Basophils Relative: 1 %
Eosinophils Absolute: 0.1 10*3/uL (ref 0.0–0.5)
Eosinophils Relative: 1 %
HCT: 39 % (ref 39.0–52.0)
Hemoglobin: 13.4 g/dL (ref 13.0–17.0)
Immature Granulocytes: 0 %
Lymphocytes Relative: 27 %
Lymphs Abs: 2.1 10*3/uL (ref 0.7–4.0)
MCH: 33.4 pg (ref 26.0–34.0)
MCHC: 34.4 g/dL (ref 30.0–36.0)
MCV: 97.3 fL (ref 80.0–100.0)
Monocytes Absolute: 0.7 10*3/uL (ref 0.1–1.0)
Monocytes Relative: 10 %
Neutro Abs: 4.7 10*3/uL (ref 1.7–7.7)
Neutrophils Relative %: 61 %
Platelets: 286 10*3/uL (ref 150–400)
RBC: 4.01 MIL/uL — ABNORMAL LOW (ref 4.22–5.81)
RDW: 12.3 % (ref 11.5–15.5)
WBC: 7.7 10*3/uL (ref 4.0–10.5)
nRBC: 0 % (ref 0.0–0.2)

## 2023-03-11 NOTE — Patient Instructions (Addendum)
SURGICAL WAITING ROOM VISITATION  Patients having surgery or a procedure may have no more than 2 support people in the waiting area - these visitors may rotate.    Children under the age of 33 must have an adult with them who is not the patient.  Due to an increase in RSV and influenza rates and associated hospitalizations, children ages 2 and under may not visit patients in [facility] hospitals.  If the patient needs to stay at the hospital during part of their recovery, the visitor guidelines for inpatient rooms apply. Pre-op nurse will coordinate an appropriate time for 1 support person to accompany patient in pre-op.  This support person may not rotate.    Please refer to the [facility] website for the visitor guidelines for Inpatients (after your surgery is over and you are in a regular room).       Your procedure is scheduled on: 03/21/23   Report to [facility] Main Entrance    Report to admitting at  10:40 AM   Call this number if you have problems the morning of surgery [phone]   Do not eat food :After Midnight.   After Midnight you may have the following liquids until 10AM DAY OF SURGERY  Water Non-Citrus Juices (without pulp, NO RED-Apple, White grape, White cranberry) Black Coffee (NO MILK/CREAM OR CREAMERS, sugar ok)  Clear Tea (NO MILK/CREAM OR CREAMERS, sugar ok) regular and decaf                             Plain Jell-O (NO RED)                                           Fruit ices (not with fruit pulp, NO RED)                                     Popsicles (NO RED)                                                               Sports drinks like Gatorade (NO RED)                  The day of surgery:  Drink ONE (1) Pre-Surgery Clear Ensure at 10 AM the morning of surgery. Drink in one sitting. Do not sip.  This drink was given to you during your hospital  pre-op appointment visit. Nothing else to drink after completing the  Pre-Surgery Clear  Ensure     Oral Hygiene is also important to reduce your risk of infection.                                    Remember - BRUSH YOUR TEETH THE MORNING OF SURGERY WITH YOUR REGULAR TOOTHPASTE   Stop all vitamins and herbal supplements 7 days before surgery.   Take these medicines the morning of surgery with A SIP OF WATER: Carvedilol, Rosuvastatin  You may not have any metal on your body including hair pins, jewelry, and body piercing             Do not wear make-up, lotions, powders, perfumes/cologne, or deodorant              Men may shave face and neck.   Do not bring valuables to the hospital. [facility] IS NOT             RESPONSIBLE   FOR VALUABLES.   Contacts, glasses, dentures or bridgework may not be worn into surgery.  DO NOT BRING YOUR HOME MEDICATIONS TO THE HOSPITAL. PHARMACY WILL DISPENSE MEDICATIONS LISTED ON YOUR MEDICATION LIST TO YOU DURING YOUR ADMISSION IN THE HOSPITAL!    Patients discharged on the day of surgery will not be allowed to drive home.  Someone NEEDS to stay with you for the first 24 hours after anesthesia.   Special Instructions: Bring a copy of your healthcare power of attorney and living will documents the day of surgery if you haven't scanned them before.              Please read over the following fact sheets you were given: IF YOU HAVE QUESTIONS ABOUT YOUR PRE-OP INSTRUCTIONS PLEASE CALL [phone] Rosey Bath   If you received a COVID test during your pre-op visit  it is requested that you wear a mask when out in public, stay away from anyone that may not be feeling well and notify your surgeon if you develop symptoms. If you test positive for Covid or have been in contact with anyone that has tested positive in the last 10 days please notify you surgeon.      Pre-operative 5 CHG Bath Instructions   You can play a key role in reducing the risk of infection after surgery. Your skin needs to be as free of germs as possible. You can  reduce the number of germs on your skin by washing with CHG (chlorhexidine gluconate) soap before surgery. CHG is an antiseptic soap that kills germs and continues to kill germs even after washing.   DO NOT use if you have an allergy to chlorhexidine/CHG or antibacterial soaps. If your skin becomes reddened or irritated, stop using the CHG and notify one of our RNs at [phone].   Please shower with the CHG soap starting 4 days before surgery using the following schedule:     Please keep in mind the following:  DO NOT shave, including legs and underarms, starting the day of your first shower.   You may shave your face at any point before/day of surgery.  Place clean sheets on your bed the day you start using CHG soap. Use a clean washcloth (not used since being washed) for each shower. DO NOT sleep with pets once you start using the CHG.   CHG Shower Instructions:  If you choose to wash your hair and private area, wash first with your normal shampoo/soap.  After you use shampoo/soap, rinse your hair and body thoroughly to remove shampoo/soap residue.  Turn the water OFF and apply about 3 tablespoons (45 ml) of CHG soap to a CLEAN washcloth.  Apply CHG soap ONLY FROM YOUR NECK DOWN TO YOUR TOES (washing for 3-5 minutes)  DO NOT use CHG soap on face, private areas, open wounds, or sores.  Pay special attention to the area where your surgery is being performed.  If you are having back surgery, having someone wash your back for you  may be helpful. Wait 2 minutes after CHG soap is applied, then you may rinse off the CHG soap.  Pat dry with a clean towel  Put on clean clothes/pajamas   If you choose to wear lotion, please use ONLY the CHG-compatible lotions on the back of this paper.     Additional instructions for the day of surgery: DO NOT APPLY any lotions, deodorants, cologne, or perfumes.   Put on clean/comfortable clothes.  Brush your teeth.  Ask your nurse before applying any  prescription medications to the skin.      CHG Compatible Lotions   Aveeno Moisturizing lotion  Cetaphil Moisturizing Cream  Cetaphil Moisturizing Lotion  Clairol Herbal Essence Moisturizing Lotion, Dry Skin  Clairol Herbal Essence Moisturizing Lotion, Extra Dry Skin  Clairol Herbal Essence Moisturizing Lotion, Normal Skin  Curel Age Defying Therapeutic Moisturizing Lotion with Alpha Hydroxy  Curel Extreme Care Body Lotion  Curel Soothing Hands Moisturizing Hand Lotion  Curel Therapeutic Moisturizing Cream, Fragrance-Free  Curel Therapeutic Moisturizing Lotion, Fragrance-Free  Curel Therapeutic Moisturizing Lotion, Original Formula  Eucerin Daily Replenishing Lotion  Eucerin Dry Skin Therapy Plus Alpha Hydroxy Crme  Eucerin Dry Skin Therapy Plus Alpha Hydroxy Lotion  Eucerin Original Crme  Eucerin Original Lotion  Eucerin Plus Crme Eucerin Plus Lotion  Eucerin TriLipid Replenishing Lotion  Keri Anti-Bacterial Hand Lotion  Keri Deep Conditioning Original Lotion Dry Skin Formula Softly Scented  Keri Deep Conditioning Original Lotion, Fragrance Free Sensitive Skin Formula  Keri Lotion Fast Absorbing Fragrance Free Sensitive Skin Formula  Keri Lotion Fast Absorbing Softly Scented Dry Skin Formula  Keri Original Lotion  Keri Skin Renewal Lotion Keri Silky Smooth Lotion  Keri Silky Smooth Sensitive Skin Lotion  Nivea Body Creamy Conditioning Oil  Nivea Body Extra Enriched Lotion  Nivea Body Original Lotion  Nivea Body Sheer Moisturizing Lotion Nivea Crme  Nivea Skin Firming Lotion  NutraDerm 30 Skin Lotion  NutraDerm Skin Lotion  NutraDerm Therapeutic Skin Cream  NutraDerm Therapeutic Skin Lotion  ProShield Protective Hand Cream   Incentive Spirometer (Watch this video at home: [email or web address])  An incentive spirometer is a tool that can help keep your lungs clear and active. This tool measures how well you are filling your lungs with each  breath. Taking long deep breaths may help reverse or decrease the chance of developing breathing (pulmonary) problems (especially infection) following: A long period of time when you are unable to move or be active. BEFORE THE PROCEDURE  If the spirometer includes an indicator to show your best effort, your nurse or respiratory therapist will set it to a desired goal. If possible, sit up straight or lean slightly forward. Try not to slouch. Hold the incentive spirometer in an upright position. INSTRUCTIONS FOR USE  Sit on the edge of your bed if possible, or sit up as far as you can in bed or on a chair. Hold the incentive spirometer in an upright position. Breathe out normally. Place the mouthpiece in your mouth and seal your lips tightly around it. Breathe in slowly and as deeply as possible, raising the piston or the ball toward the top of the column. Hold your breath for 3-5 seconds or for as long as possible. Allow the piston or ball to fall to the bottom of the column. Remove the mouthpiece from your mouth and breathe out normally. Rest for a few seconds and repeat Steps 1 through 7 at least 10 times every 1-2 hours when you are awake. Take your time and take  a few normal breaths between deep breaths. The spirometer may include an indicator to show your best effort. Use the indicator as a goal to work toward during each repetition. After each set of 10 deep breaths, practice coughing to be sure your lungs are clear. If you have an incision (the cut made at the time of surgery), support your incision when coughing by placing a pillow or rolled up towels firmly against it. Once you are able to get out of bed, walk around indoors and cough well. You may stop using the incentive spirometer when instructed by your caregiver.  RISKS AND COMPLICATIONS Take your time so you do not get dizzy or light-headed. If you are in pain, you may need to take or ask for pain medication before doing incentive  spirometry. It is harder to take a deep breath if you are having pain. AFTER USE Rest and breathe slowly and easily. It can be helpful to keep track of a log of your progress. Your caregiver can provide you with a simple table to help with this. If you are using the spirometer at home, follow these instructions: SEEK MEDICAL CARE IF:  You are having difficultly using the spirometer. You have trouble using the spirometer as often as instructed. Your pain medication is not giving enough relief while using the spirometer. You develop fever of 100.5 F (38.1 C) or higher. SEEK IMMEDIATE MEDICAL CARE IF:  You cough up bloody sputum that had not been present before. You develop fever of 102 F (38.9 C) or greater. You develop worsening pain at or near the incision site. MAKE SURE YOU:  Understand these instructions. Will watch your condition. Will get help right away if you are not doing well or get worse. Document Released: 08/02/2006 Document Revised: 06/14/2011 Document Reviewed: 10/03/2006 [facility] Patient Information 2014 [place], [place].

## 2023-03-17 ENCOUNTER — Ambulatory Visit: Payer: No Typology Code available for payment source | Admitting: Family Medicine

## 2023-03-18 ENCOUNTER — Telehealth (HOSPITAL_BASED_OUTPATIENT_CLINIC_OR_DEPARTMENT_OTHER): Payer: Self-pay | Admitting: Cardiovascular Disease

## 2023-03-18 NOTE — Telephone Encounter (Signed)
Pt c/o medication issue:  1. Name of Medication:   Olmesartan-amLODIPine-HCTZ 40-5-25 MG TABS   2. How are you currently taking this medication (dosage and times per day)?   3. Are you having a reaction (difficulty breathing--STAT)?   4. What is your medication issue?   Caller Okey Dupre) stated they will need clarification on whether patient should still be taking this medication. Ref#  [id number]

## 2023-03-18 NOTE — Telephone Encounter (Signed)
Called pharmacy [facility]  Pharmacy states:   -would like to know if patient is taking Olmesartan-Amlodipine-hydrochlorothiazide   -received refill for combination on 11/13 with conflicting note   -patient also has prescription for Irbesartan 300mg , last filled on 10/6  -is patient supposed to continue Irbesartan with olmesartan-Amlodipine-HCTZ

## 2023-03-18 NOTE — Telephone Encounter (Signed)
He is to continue Olmesartan-Amlodipine-hydrochlorothiazide and stop Irbesartan.   TY! Jacob Sorrow, NP

## 2023-03-18 NOTE — Telephone Encounter (Signed)
Called [facility] , spoke with Joe  Informed Joe:  -patient is to continue Olmesartan-Amlodipine-hydrochlorothiazide and stop Irbesartan

## 2023-03-20 NOTE — Anesthesia Preprocedure Evaluation (Signed)
Anesthesia Evaluation  Patient identified by MRN, date of birth, ID band Patient awake    Reviewed: Allergy & Precautions, NPO status , Patient's Chart, lab work & pertinent test results, reviewed documented beta blocker date and time   History of Anesthesia Complications (+) PONV and history of anesthetic complications  Airway Mallampati: II  TM Distance: >3 FB Neck ROM: Full    Dental no notable dental hx.    Pulmonary neg pulmonary ROS   Pulmonary exam normal        Cardiovascular hypertension, Pt. on medications and Pt. on home beta blockers Normal cardiovascular exam     Neuro/Psych negative neurological ROS     GI/Hepatic negative GI ROS, Neg liver ROS,,,  Endo/Other  negative endocrine ROS    Renal/GU negative Renal ROS     Musculoskeletal  (+) Arthritis ,    Abdominal   Peds  Hematology negative hematology ROS (+)   Anesthesia Other Findings Day of surgery medications reviewed with patient.  Reproductive/Obstetrics                              Anesthesia Physical Anesthesia Plan  ASA: 2  Anesthesia Plan: Spinal   Post-op Pain Management: Tylenol PO (pre-op)* and Regional block*   Induction:   PONV Risk Score and Plan: 3 and Treatment may vary due to age or medical condition, Ondansetron, Propofol infusion, Dexamethasone and Midazolam  Airway Management Planned: Natural Airway and Simple Face Mask  Additional Equipment: None  Intra-op Plan:   Post-operative Plan:   Informed Consent: I have reviewed the patients History and Physical, chart, labs and discussed the procedure including the risks, benefits and alternatives for the proposed anesthesia with the patient or authorized representative who has indicated his/her understanding and acceptance.       Plan Discussed with: CRNA  Anesthesia Plan Comments:         Anesthesia Quick Evaluation

## 2023-03-21 ENCOUNTER — Ambulatory Visit (HOSPITAL_COMMUNITY): Payer: No Typology Code available for payment source | Admitting: Anesthesiology

## 2023-03-21 ENCOUNTER — Ambulatory Visit (HOSPITAL_COMMUNITY)
Admission: RE | Admit: 2023-03-21 | Discharge: 2023-03-21 | Disposition: A | Discharge status: Home / Self Care | Payer: No Typology Code available for payment source | Source: Ambulatory Visit | Attending: Orthopedic Surgery | Admitting: Orthopedic Surgery

## 2023-03-21 ENCOUNTER — Other Ambulatory Visit: Payer: Self-pay

## 2023-03-21 ENCOUNTER — Ambulatory Visit (HOSPITAL_BASED_OUTPATIENT_CLINIC_OR_DEPARTMENT_OTHER): Payer: No Typology Code available for payment source | Admitting: Family

## 2023-03-21 ENCOUNTER — Encounter (HOSPITAL_COMMUNITY): Payer: Self-pay | Admitting: Orthopedic Surgery

## 2023-03-21 ENCOUNTER — Ambulatory Visit (HOSPITAL_COMMUNITY): Payer: No Typology Code available for payment source

## 2023-03-21 ENCOUNTER — Encounter (HOSPITAL_COMMUNITY)
Admission: RE | Disposition: A | Discharge status: Home / Self Care | Payer: Self-pay | Source: Ambulatory Visit | Attending: Orthopedic Surgery

## 2023-03-21 DIAGNOSIS — M1712 Unilateral primary osteoarthritis, left knee: Secondary | ICD-10-CM

## 2023-03-21 DIAGNOSIS — Z79899 Other long term (current) drug therapy: Secondary | ICD-10-CM | POA: Diagnosis not present

## 2023-03-21 DIAGNOSIS — I10 Essential (primary) hypertension: Secondary | ICD-10-CM | POA: Insufficient documentation

## 2023-03-21 DIAGNOSIS — Z79624 Long term (current) use of inhibitors of nucleotide synthesis: Secondary | ICD-10-CM | POA: Diagnosis not present

## 2023-03-21 HISTORY — PX: TOTAL KNEE ARTHROPLASTY: SHX125

## 2023-03-21 LAB — TYPE AND SCREEN
ABO/RH(D): O POS
Antibody Screen: NEGATIVE

## 2023-03-21 LAB — ABO/RH: ABO/RH(D): O POS

## 2023-03-21 SURGERY — ARTHROPLASTY, KNEE, TOTAL
Anesthesia: Spinal | Site: Knee | Laterality: Left

## 2023-03-21 MED ORDER — PROPOFOL 500 MG/50ML IV EMUL
INTRAVENOUS | Status: DC | PRN
  Administered 2023-03-21: 40 ug/kg/min via INTRAVENOUS

## 2023-03-21 MED ORDER — WATER FOR IRRIGATION, STERILE IR SOLN
Status: DC | PRN
  Administered 2023-03-21: 1000 mL

## 2023-03-21 MED ORDER — ISOPROPYL ALCOHOL 70 % SOLN
Status: DC | PRN
  Administered 2023-03-21: 1 via TOPICAL

## 2023-03-21 MED ORDER — POVIDONE-IODINE 10 % EX SWAB
2.0000 | Freq: Once | CUTANEOUS | Status: DC
Start: 2023-03-21 — End: 2023-03-21

## 2023-03-21 MED ORDER — CHLORHEXIDINE GLUCONATE 0.12 % MT SOLN
15.0000 mL | Freq: Once | OROMUCOSAL | Status: AC
Start: 2023-03-21 — End: 2023-03-21
  Administered 2023-03-21: 15 mL via OROMUCOSAL

## 2023-03-21 MED ORDER — BUPIVACAINE LIPOSOME 1.3 % IJ SUSP: 20.0000 mL | Freq: Once | INTRAMUSCULAR | Status: DC

## 2023-03-21 MED ORDER — PROMETHAZINE HCL 12.5 MG RE SUPP: 12.5000 mg | Freq: Four times a day (QID) | RECTAL | Status: DC | PRN

## 2023-03-21 MED ORDER — PROPOFOL 10 MG/ML IV BOLUS
INTRAVENOUS | Status: AC
  Filled 2023-03-21: qty 20

## 2023-03-21 MED ORDER — ACETAMINOPHEN 500 MG PO TABS: 1000.0000 mg | ORAL_TABLET | Freq: Four times a day (QID) | ORAL | Status: DC

## 2023-03-21 MED ORDER — ZOLPIDEM TARTRATE ER 6.25 MG PO TBCR: 6.2500 mg | EXTENDED_RELEASE_TABLET | Freq: Every evening | ORAL | 0 refills | Status: AC | PRN

## 2023-03-21 MED ORDER — ONDANSETRON HCL 4 MG/2ML IJ SOLN
INTRAMUSCULAR | Status: AC
  Filled 2023-03-21: qty 2

## 2023-03-21 MED ORDER — DEXAMETHASONE SODIUM PHOSPHATE 10 MG/ML IJ SOLN
INTRAMUSCULAR | Status: AC
  Filled 2023-03-21: qty 1

## 2023-03-21 MED ORDER — DEXAMETHASONE SODIUM PHOSPHATE 10 MG/ML IJ SOLN
8.0000 mg | Freq: Once | INTRAMUSCULAR | Status: AC
  Administered 2023-03-21: 8 mg via INTRAVENOUS

## 2023-03-21 MED ORDER — CEFAZOLIN SODIUM-DEXTROSE 2-4 GM/100ML-% IV SOLN
2.0000 g | INTRAVENOUS | Status: AC
  Administered 2023-03-21: 2 g via INTRAVENOUS
  Filled 2023-03-21: qty 100

## 2023-03-21 MED ORDER — PROPOFOL 10 MG/ML IV BOLUS
INTRAVENOUS | Status: DC | PRN
  Administered 2023-03-21: 20 mg via INTRAVENOUS

## 2023-03-21 MED ORDER — METHOCARBAMOL 500 MG PO TABS: 500.0000 mg | ORAL_TABLET | Freq: Three times a day (TID) | ORAL | 0 refills | Status: AC | PRN

## 2023-03-21 MED ORDER — SODIUM CHLORIDE 0.9 % IR SOLN
Status: DC | PRN
  Administered 2023-03-21: 1000 mL

## 2023-03-21 MED ORDER — ACETAMINOPHEN 500 MG PO TABS: 1000.0000 mg | ORAL_TABLET | Freq: Three times a day (TID) | ORAL | Status: AC | PRN

## 2023-03-21 MED ORDER — 0.9 % SODIUM CHLORIDE (POUR BTL) OPTIME
TOPICAL | Status: DC | PRN
  Administered 2023-03-21: 1000 mL

## 2023-03-21 MED ORDER — ACETAMINOPHEN 500 MG PO TABS: 1000.0000 mg | ORAL_TABLET | Freq: Once | ORAL | Status: DC

## 2023-03-21 MED ORDER — EPHEDRINE 5 MG/ML INJ
INTRAVENOUS | Status: AC
  Filled 2023-03-21: qty 5

## 2023-03-21 MED ORDER — ASPIRIN 81 MG PO TBEC: 81.0000 mg | DELAYED_RELEASE_TABLET | Freq: Two times a day (BID) | ORAL | Status: AC

## 2023-03-21 MED ORDER — ISOPROPYL ALCOHOL 70 % SOLN
Status: AC
  Filled 2023-03-21: qty 480

## 2023-03-21 MED ORDER — CEFAZOLIN SODIUM-DEXTROSE 2-4 GM/100ML-% IV SOLN: 2.0000 g | Freq: Four times a day (QID) | INTRAVENOUS | Status: DC

## 2023-03-21 MED ORDER — LACTATED RINGERS IV BOLUS
500.0000 mL | Freq: Once | INTRAVENOUS | Status: AC
  Administered 2023-03-21: 500 mL via INTRAVENOUS

## 2023-03-21 MED ORDER — BUPIVACAINE-EPINEPHRINE 0.25% -1:200000 IJ SOLN
INTRAMUSCULAR | Status: AC
  Filled 2023-03-21: qty 1

## 2023-03-21 MED ORDER — CLONIDINE HCL (ANALGESIA) 100 MCG/ML EP SOLN
EPIDURAL | Status: DC | PRN
  Administered 2023-03-21: 100 ug

## 2023-03-21 MED ORDER — ORAL CARE MOUTH RINSE: 15.0000 mL | Freq: Once | OROMUCOSAL | Status: AC

## 2023-03-21 MED ORDER — SODIUM CHLORIDE (PF) 0.9 % IJ SOLN
INTRAMUSCULAR | Status: AC
  Filled 2023-03-21: qty 30

## 2023-03-21 MED ORDER — PROPOFOL 1000 MG/100ML IV EMUL
INTRAVENOUS | Status: AC
  Filled 2023-03-21: qty 100

## 2023-03-21 MED ORDER — ONDANSETRON HCL 4 MG/2ML IJ SOLN
INTRAMUSCULAR | Status: DC | PRN
  Administered 2023-03-21: 4 mg via INTRAVENOUS

## 2023-03-21 MED ORDER — OXYCODONE HCL 5 MG PO TABS: 5.0000 mg | ORAL_TABLET | ORAL | Status: DC | PRN

## 2023-03-21 MED ORDER — LACTATED RINGERS IV BOLUS
250.0000 mL | Freq: Once | INTRAVENOUS | Status: AC
  Administered 2023-03-21: 250 mL via INTRAVENOUS

## 2023-03-21 MED ORDER — SODIUM CHLORIDE 0.9 % IV SOLN
INTRAVENOUS | Status: DC
Start: 2023-03-21 — End: 2023-03-21

## 2023-03-21 MED ORDER — ACETAMINOPHEN 325 MG PO TABS: 325.0000 mg | ORAL_TABLET | Freq: Four times a day (QID) | ORAL | Status: DC | PRN

## 2023-03-21 MED ORDER — METHOCARBAMOL 1000 MG/10ML IJ SOLN
500.0000 mg | Freq: Four times a day (QID) | INTRAMUSCULAR | Status: DC | PRN
Start: 2023-03-21 — End: 2023-03-21

## 2023-03-21 MED ORDER — LIDOCAINE 2% (20 MG/ML) 5 ML SYRINGE
INTRAMUSCULAR | Status: DC | PRN
  Administered 2023-03-21: 40 mg via INTRAVENOUS

## 2023-03-21 MED ORDER — MIDAZOLAM HCL 2 MG/2ML IJ SOLN
INTRAMUSCULAR | Status: DC | PRN
  Administered 2023-03-21: 1 mg via INTRAVENOUS

## 2023-03-21 MED ORDER — MIDAZOLAM HCL 2 MG/2ML IJ SOLN
0.5000 mg | INTRAMUSCULAR | Status: DC
  Administered 2023-03-21: 1 mg via INTRAVENOUS
  Filled 2023-03-21: qty 2

## 2023-03-21 MED ORDER — PROMETHAZINE HCL 12.5 MG PO TABS: 12.5000 mg | ORAL_TABLET | Freq: Three times a day (TID) | ORAL | 0 refills | Status: AC | PRN

## 2023-03-21 MED ORDER — MIDAZOLAM HCL 2 MG/2ML IJ SOLN
INTRAMUSCULAR | Status: AC
  Filled 2023-03-21: qty 2

## 2023-03-21 MED ORDER — DROPERIDOL 2.5 MG/ML IJ SOLN: 0.6250 mg | Freq: Once | INTRAMUSCULAR | Status: DC | PRN

## 2023-03-21 MED ORDER — OXYCODONE HCL 5 MG PO TABS
ORAL_TABLET | ORAL | Status: AC
  Administered 2023-03-21: 5 mg via ORAL
  Filled 2023-03-21: qty 1

## 2023-03-21 MED ORDER — LIDOCAINE HCL (PF) 2 % IJ SOLN
INTRAMUSCULAR | Status: AC
  Filled 2023-03-21: qty 5

## 2023-03-21 MED ORDER — PHENYLEPHRINE HCL-NACL 20-0.9 MG/250ML-% IV SOLN
INTRAVENOUS | Status: DC | PRN
  Administered 2023-03-21: 20 ug/min via INTRAVENOUS

## 2023-03-21 MED ORDER — FENTANYL CITRATE PF 50 MCG/ML IJ SOSY
25.0000 ug | PREFILLED_SYRINGE | INTRAMUSCULAR | Status: DC
  Administered 2023-03-21: 50 ug via INTRAVENOUS
  Filled 2023-03-21: qty 2

## 2023-03-21 MED ORDER — PROMETHAZINE HCL 25 MG PO TABS: 25.0000 mg | ORAL_TABLET | Freq: Four times a day (QID) | ORAL | Status: DC | PRN

## 2023-03-21 MED ORDER — FENTANYL CITRATE PF 50 MCG/ML IJ SOSY: 25.0000 ug | PREFILLED_SYRINGE | INTRAMUSCULAR | Status: DC | PRN

## 2023-03-21 MED ORDER — KETOROLAC TROMETHAMINE 15 MG/ML IJ SOLN
INTRAMUSCULAR | Status: AC
  Filled 2023-03-21: qty 1

## 2023-03-21 MED ORDER — EPHEDRINE SULFATE-NACL 50-0.9 MG/10ML-% IV SOSY
PREFILLED_SYRINGE | INTRAVENOUS | Status: DC | PRN
  Administered 2023-03-21 (×5): 5 mg via INTRAVENOUS

## 2023-03-21 MED ORDER — KETOROLAC TROMETHAMINE 15 MG/ML IJ SOLN: 7.5000 mg | Freq: Four times a day (QID) | INTRAMUSCULAR | Status: DC

## 2023-03-21 MED ORDER — METHOCARBAMOL 500 MG PO TABS
ORAL_TABLET | ORAL | Status: AC
  Filled 2023-03-21: qty 1

## 2023-03-21 MED ORDER — CELECOXIB 100 MG PO CAPS: 100.0000 mg | ORAL_CAPSULE | Freq: Two times a day (BID) | ORAL | 0 refills | Status: AC

## 2023-03-21 MED ORDER — SODIUM CHLORIDE 0.9 % IV SOLN: 12.5000 mg | Freq: Four times a day (QID) | INTRAVENOUS | Status: DC | PRN

## 2023-03-21 MED ORDER — OXYCODONE HCL 5 MG PO TABS: 5.0000 mg | ORAL_TABLET | Freq: Once | ORAL | Status: DC | PRN

## 2023-03-21 MED ORDER — HYDROMORPHONE HCL 1 MG/ML IJ SOLN: 0.5000 mg | INTRAMUSCULAR | Status: DC | PRN

## 2023-03-21 MED ORDER — BUPIVACAINE LIPOSOME 1.3 % IJ SUSP
INTRAMUSCULAR | Status: AC
  Filled 2023-03-21: qty 20

## 2023-03-21 MED ORDER — ACETAMINOPHEN 500 MG PO TABS
1000.0000 mg | ORAL_TABLET | Freq: Once | ORAL | Status: AC
  Administered 2023-03-21: 1000 mg via ORAL
  Filled 2023-03-21: qty 2

## 2023-03-21 MED ORDER — TRANEXAMIC ACID-NACL 1000-0.7 MG/100ML-% IV SOLN
1000.0000 mg | INTRAVENOUS | Status: AC
  Administered 2023-03-21: 1000 mg via INTRAVENOUS
  Filled 2023-03-21: qty 100

## 2023-03-21 MED ORDER — SODIUM CHLORIDE (PF) 0.9 % IJ SOLN
INTRAMUSCULAR | Status: DC | PRN
  Administered 2023-03-21: 80 mL

## 2023-03-21 MED ORDER — BUPIVACAINE-EPINEPHRINE (PF) 0.5% -1:200000 IJ SOLN
INTRAMUSCULAR | Status: DC | PRN
  Administered 2023-03-21: 15 mL via PERINEURAL

## 2023-03-21 MED ORDER — BUPIVACAINE IN DEXTROSE 0.75-8.25 % IT SOLN
INTRATHECAL | Status: DC | PRN
  Administered 2023-03-21: 1.7 mL via INTRATHECAL

## 2023-03-21 MED ORDER — LACTATED RINGERS IV SOLN: INTRAVENOUS | Status: DC

## 2023-03-21 MED ORDER — LACTATED RINGERS IV BOLUS: 250.0000 mL | Freq: Once | INTRAVENOUS | Status: DC

## 2023-03-21 MED ORDER — METHOCARBAMOL 500 MG PO TABS
500.0000 mg | ORAL_TABLET | Freq: Four times a day (QID) | ORAL | Status: DC | PRN
Start: 2023-03-21 — End: 2023-03-21
  Administered 2023-03-21: 500 mg via ORAL

## 2023-03-21 MED ORDER — OXYCODONE HCL 5 MG/5ML PO SOLN: 5.0000 mg | Freq: Once | ORAL | Status: DC | PRN

## 2023-03-21 MED ORDER — OXYCODONE HCL 5 MG PO TABS: 5.0000 mg | ORAL_TABLET | ORAL | 0 refills | Status: AC | PRN

## 2023-03-21 SURGICAL SUPPLY — 55 items
BAG COUNTER SPONGE SURGICOUNT (BAG) IMPLANT
BLADE SAG 18X100X1.27 (BLADE) ×1 IMPLANT
BLADE SAW SAG 35X64 .89 (BLADE) ×1 IMPLANT
BNDG COHESIVE 3X5 TAN ST LF (GAUZE/BANDAGES/DRESSINGS) ×1 IMPLANT
BNDG ELASTIC 6X10 VLCR STRL LF (GAUZE/BANDAGES/DRESSINGS) ×1 IMPLANT
BOWL SMART MIX CTS (DISPOSABLE) ×1 IMPLANT
CEMENT BONE R 1X40 (Cement) IMPLANT
CEMENT BONE REFOBACIN R1X40 US (Cement) IMPLANT
CHLORAPREP W/TINT 26 (MISCELLANEOUS) ×2 IMPLANT
COMP FEM PS KNEE STD 10 LT (Knees) ×1 IMPLANT
COMP PATELLA 3 PEG 35 (Joint) ×1 IMPLANT
COMP TIB PS G 0D LT (Joint) ×1 IMPLANT
COMPONENT FEM PS KN STD 10 LT (Knees) IMPLANT
COMPONENT PATELLA 3 PEG 35 (Joint) IMPLANT
COMPONET TIB PS G 0D LT (Joint) IMPLANT
COVER SURGICAL LIGHT HANDLE (MISCELLANEOUS) ×1 IMPLANT
CUFF TRNQT CYL 34X4.125X (TOURNIQUET CUFF) ×1 IMPLANT
DERMABOND ADVANCED .7 DNX12 (GAUZE/BANDAGES/DRESSINGS) ×1 IMPLANT
DRAPE INCISE IOBAN 85X60 (DRAPES) ×1 IMPLANT
DRAPE SHEET LG 3/4 BI-LAMINATE (DRAPES) ×1 IMPLANT
DRAPE U-SHAPE 47X51 STRL (DRAPES) ×1 IMPLANT
DRSG AQUACEL AG ADV 3.5X10 (GAUZE/BANDAGES/DRESSINGS) ×1 IMPLANT
DRSG AQUACEL AG ADV 3.5X14 (GAUZE/BANDAGES/DRESSINGS) IMPLANT
ELECT REM PT RETURN 15FT ADLT (MISCELLANEOUS) ×1 IMPLANT
GAUZE SPONGE 4X4 12PLY STRL (GAUZE/BANDAGES/DRESSINGS) ×1 IMPLANT
GLOVE BIO SURGEON STRL SZ 6.5 (GLOVE) ×2 IMPLANT
GLOVE BIOGEL PI IND STRL 6.5 (GLOVE) ×1 IMPLANT
GLOVE BIOGEL PI IND STRL 8 (GLOVE) ×1 IMPLANT
GLOVE SURG ORTHO 8.0 STRL STRW (GLOVE) ×2 IMPLANT
GOWN STRL REUS W/ TWL XL LVL3 (GOWN DISPOSABLE) ×2 IMPLANT
HOLDER FOLEY CATH W/STRAP (MISCELLANEOUS) ×1 IMPLANT
HOOD PEEL AWAY T7 (MISCELLANEOUS) ×3 IMPLANT
INSERT ARTISURF S8-11 18X22X14 (Insert) IMPLANT
KIT TURNOVER KIT A (KITS) IMPLANT
MANIFOLD NEPTUNE II (INSTRUMENTS) ×1 IMPLANT
MARKER SKIN DUAL TIP RULER LAB (MISCELLANEOUS) ×1 IMPLANT
NS IRRIG 1000ML POUR BTL (IV SOLUTION) ×1 IMPLANT
PACK TOTAL KNEE CUSTOM (KITS) ×1 IMPLANT
PIN DRILL HDLS TROCAR 75 4PK (PIN) IMPLANT
SCREW HEADED 33MM KNEE (MISCELLANEOUS) IMPLANT
SET HNDPC FAN SPRY TIP SCT (DISPOSABLE) ×1 IMPLANT
SOLUTION IRRIG SURGIPHOR (IV SOLUTION) IMPLANT
SPIKE FLUID TRANSFER (MISCELLANEOUS) ×1 IMPLANT
STRIP CLOSURE SKIN 1/2X4 (GAUZE/BANDAGES/DRESSINGS) ×1 IMPLANT
SUT MNCRL AB 3-0 PS2 18 (SUTURE) ×1 IMPLANT
SUT STRATAFIX 0 PDS 27 VIOLET (SUTURE) ×1
SUT STRATAFIX 14 PDO 48 VLT (SUTURE) ×1 IMPLANT
SUT STRATAFIX PDO 1 14 VIOLET (SUTURE) ×1
SUT VIC AB 2-0 CT2 27 (SUTURE) ×2 IMPLANT
SUTURE STRATFX 0 PDS 27 VIOLET (SUTURE) ×1 IMPLANT
SYR 50ML LL SCALE MARK (SYRINGE) ×1 IMPLANT
TRAY FOLEY MTR SLVR 14FR STAT (SET/KITS/TRAYS/PACK) IMPLANT
TUBE SUCTION HIGH CAP CLEAR NV (SUCTIONS) ×1 IMPLANT
UNDERPAD 30X36 HEAVY ABSORB (UNDERPADS AND DIAPERS) ×1 IMPLANT
WRAP KNEE MAXI GEL POST OP (GAUZE/BANDAGES/DRESSINGS) IMPLANT

## 2023-03-21 NOTE — Transfer of Care (Signed)
Immediate Anesthesia Transfer of Care Note  Patient: Jacob Rodriguez  Procedure(s) Performed: TOTAL KNEE ARTHROPLASTY (Left: Knee)  Patient Location: PACU  Anesthesia Type:Spinal  Level of Consciousness: awake, alert , and patient cooperative  Airway & Oxygen Therapy: Patient Spontanous Breathing and Patient connected to face mask oxygen  Post-op Assessment: Report given to RN and Post -op Vital signs reviewed and stable  Post vital signs: Reviewed and stable  Last Vitals:  Vitals Value Taken Time  BP 108/77 03/21/23 1128  Temp 36.7 C 03/21/23 1128  Pulse 64 03/21/23 1129  Resp 13 03/21/23 1129  SpO2 98 % 03/21/23 1129  Vitals shown include unfiled device data.  Last Pain:  Vitals:   03/21/23 0850  TempSrc:   PainSc: 0-No pain         Complications: No notable events documented.

## 2023-03-21 NOTE — Op Note (Signed)
DATE OF SURGERY:  03/21/2023 TIME: 10:44 AM  PATIENT NAME:  Jacob Rodriguez   AGE: 67 y.o.   PRE-OPERATIVE DIAGNOSIS: End-stage left knee osteoarthritis  POST-OPERATIVE DIAGNOSIS:  Same  PROCEDURE: Press-fit left total Knee Arthroplasty  SURGEON:  Anasofia Micallef A Ladaja Yusupov, MD   ASSISTANT: Kathie Dike, PA-C, present and scrubbed throughout the case, critical for assistance with exposure, retraction, instrumentation, and closure.   OPERATIVE IMPLANTS:  Press-fit left Zimmer persona size 10 standard femur porous plasma spray, size G left tibia Osseo Ti, 35 mm 3 peg press-fit patella, 10 mm MC polyethylene insert Implant Name Type Inv. Item Serial No. Manufacturer Lot No. LRB No. Used Action  COMP PATELLA 3 PEG 35 - [id number] Joint COMP PATELLA 3 PEG 35  [employer]MER [employer](ORTH,TRAU,BIO,SG) [id number] Left 1 Implanted  COMP FEM PS KNEE STD 10 LT - [id number] Knees COMP FEM PS KNEE STD 10 LT  ZIMMER [employer]ON(ORTH,TRAU,BIO,SG) [id number] Left 1 Implanted  COMP TIB PS G 0D LT - [id number] Joint COMP TIB PS G 0D LT  ZIMMER [employer]ON(ORTH,TRAU,BIO,SG) [id number] Left 1 Implanted  INSERT ARTISURF S8-11 [id number] - [id number] Insert INSERT ARTISURF S8-11 [id number]  ZIMMER RECON(ORTH,TRAU,BIO,SG) [id number] Left 1 Implanted      PREOPERATIVE INDICATIONS:  Jacob Rodriguez is a 67 y.o. year old male with end stage bone on bone degenerative arthritis of the knee who failed conservative treatment, including injections, antiinflammatories, activity modification, and assistive devices, and had significant impairment of their activities of daily living, and elected for Total Knee Arthroplasty.   The risks, benefits, and alternatives were discussed at length including but not limited to the risks of infection, bleeding, nerve injury, stiffness, blood clots, the need for revision surgery, cardiopulmonary complications, among others, and they were willing to proceed.  ESTIMATED BLOOD LOSS: 50cc  OPERATIVE DESCRIPTION:    Once adequate anesthesia was induced, preoperative antibiotics, 2 gm of ancef,1 gm of Tranexamic Acid, and 8 mg of Decadron administered, the patient was positioned supine with a left thigh tourniquet placed.  The left lower extremity was prepped and draped in sterile fashion.  A time-  out was performed identifying the patient, planned procedure, and the appropriate extremity.     The leg was  exsanguinated, tourniquet elevated to 250 mmHg.  A midline incision was  made followed by median parapatellar arthrotomy. Anterior horn of the medial meniscus was released and resected. A medial release was performed, the infrapatellar fat pad was resected with care taken to protect the patellar tendon. The suprapatellar fat was removed to exposed the distal anterior femur. The anterior horn of the lateral meniscus and ACL were released.    Following initial  exposure, I first started with the femur  The femoral  canal was opened with a drill, canal was suctioned to try to prevent fat emboli.  An  intramedullary rod was passed set at 5 degrees valgus, 10 mm. The distal femur was resected.  Following this resection, the tibia was  subluxated anteriorly.  Using the extramedullary guide, 10mm of bone was resected off  the proximal lateral tibia.  We confirmed the gap would be  stable medially and laterally with a size 10mm spacer block as well as confirmed that the tibial cut was perpendicular in the coronal plane, checking with an alignment rod.    Once this was done, the posterior femoral referencing femoral sizer was placed under to the posterior condyles with 0 degrees of external rotational which was parallel to the transepicondylar axis and perpendicular to [employer]  line. The femur was sized to be a size 10 in the anterior-  posterior dimension. The  anterior, posterior, and  chamfer cuts were made without difficulty nor   notching making certain that I was along the anterior cortex to help  with flexion gap  stability. Next a laminar spreader was placed with the knee in flexion and the medial lateral menisci were resected.  5 cc of the Exparel mixture was injected in the medial side of the back of the knee and 3 cc in the lateral side.  1/2 inch curved osteotome was used to resect posterior osteophyte that was then removed with a pituitary rongeur.       At this point, the tibia was sized to be a size G.  The size G tray was  then pinned in position. Trial reduction was now carried with a 10 femur, G tibia, a 10 mm MC insert.  The knee had full extension and was stable to varus valgus stress in extension.  The knee was slightly tight in flexion and the PCL was partially released.  Attention was next directed to the patella.  Precut  measurement was noted to be 25 mm.  I resected down to 15 mm and used a  35mm patellar button to restore patellar height as well as cover the cut surface.     The patella lug holes were drilled and a 35 mm patella poly trial was placed.    The knee was brought to full extension with good flexion stability with the patella tracking through the trochlea without application of pressure.    Next the femoral component was again assessed and determined to be seated and appropriately lateralized.  The femoral lug holes were drilled.  The femoral component was then removed. Tibial component was again assessed and felt to be seated and appropriately rotated with the medial third of the tubercle. The tibia was then drilled, and keel punched.     Final components were  opened and impacted into place.   The knee was irrigated with sterile Betadine diluted in saline as well as pulse lavage normal saline. The synovial lining was  then injected a dilute Exparel with 30cc of 0.25% marcaine with epinephrine.     I confirmed that I was satisfied with the range of motion and stability, and the final 10mm MC poly insert was chosen.  It was placed into the knee.         The tourniquet had been  let down at 65 minutes.  No significant hemostasis was required.  The medial parapatellar arthrotomy was then reapproximated using #1 Vicryl and #1 Stratafix sutures with the knee  in flexion.  The remaining wound was closed with 0 stratafix, 2-0 Vicryl, and running 3-0 Monocryl. The knee was cleaned, dried, dressed sterilely using Dermabond and   Aquacel dressing.  The patient was then brought to recovery room in stable condition, tolerating the procedure  well. There were no complications.   Post op recs: WB: WBAT Abx: ancef Imaging: PACU xrays DVT prophylaxis: Aspirin 81mg  BID x4 weeks Follow up: 2 weeks after surgery for a wound check with Dr. Blanchie Dessert at [facility].  Address: [address], [place], [place] [address]  Office Phone: [phone]  Weber Cooks, MD Orthopaedic Surgery

## 2023-03-21 NOTE — Progress Notes (Signed)
Orthopedic Tech Progress Note Patient Details:  Jacob Rodriguez 05-17-1955 [id number]  Patient ID: [name], male   DOB: 1955-07-11, 67 y.o.   MRN: [id number] Pt declined bone foam, stating he already has one from previous surgery. Darleen Crocker 03/21/2023, 12:06 PM

## 2023-03-21 NOTE — Evaluation (Signed)
Physical Therapy Evaluation Patient Details Name: Jacob Rodriguez MRN: [id number] DOB: 07-01-1955 Today's Date: 03/21/2023  History of Present Illness  Pt is a 67 yo male presenting to therapy s/p L TKA on 03/21/2023 due to failure of conservative measures.  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  Clinical Impression      Jacob Rodriguez is a 67 y.o. male POD 0 s/p L TKA. Patient reports IND with mobility at baseline. Patient is now limited by functional impairments (see PT problem list below) and requires CGA and cues for transfers and gait with RW. Patient was able to ambulate 50 feet with RW and CGA and progressing to close S and cues for safe walker management. Patient educated on safe sequencing for stair mobility with L handrail, fall risk prevention, slowly increasing activity levels, pain management and goals, use of CP/ice and car transfers pt and spouse verbalized understanding of safe guarding position for people assisting with mobility. Patient instructed in exercises to facilitate ROM and circulation reviewed and HO provided. Patient will benefit from continued skilled PT interventions to address impairments and progress towards PLOF. Patient has met mobility goals at adequate level for discharge home with family support and OPPT services; will continue to follow if pt continues acute stay to progress towards Mod I goals.     If plan is discharge home, recommend the following: A little help with walking and/or transfers;A little help with bathing/dressing/bathroom;Assistance with cooking/housework;Assist for transportation;Help with stairs or ramp for entrance   Can travel by private vehicle        Equipment Recommendations None recommended by PT  Recommendations for Other Services       Functional Status Assessment Patient has had a recent decline in their functional status and demonstrates the ability to  make significant improvements in function in a reasonable and predictable amount of time.     Precautions / Restrictions Precautions Precautions: Knee;Fall Restrictions Weight Bearing Restrictions Per Provider Order: No      Mobility  Bed Mobility Overal bed mobility: Needs Assistance Bed Mobility: Supine to Sit     Supine to sit: Supervision     General bed mobility comments: min cues    Transfers Overall transfer level: Needs assistance Equipment used: Rolling walker (2 wheels) Transfers: Sit to/from Stand Sit to Stand: Contact guard assist           General transfer comment: min cues for safety and proper RW and AD placement    Ambulation/Gait Ambulation/Gait assistance: Contact guard assist, Supervision Gait Distance (Feet): 50 Feet Assistive device: Rolling walker (2 wheels) Gait Pattern/deviations: Step-to pattern, Antalgic Gait velocity: decreased     General Gait Details: step almost through pattern with cues for RW management progressing from CGA to close S  Stairs Stairs: Yes Stairs assistance: Contact guard assist Stair Management: Two rails Number of Stairs: 2 General stair comments: trial with step navigation with B handrails with cues and CGA and then pt progressing to CGA with L handrail only with cues for sequencing  Wheelchair Mobility     Tilt Bed    Modified Rankin (Stroke Patients Only)       Balance Overall balance assessment: Needs assistance Sitting-balance support: Feet supported Sitting balance-Leahy Scale: Good     Standing balance support: Bilateral upper extremity supported, During functional activity, Reliant on assistive device for balance Standing balance-Leahy Scale: Fair Standing balance comment: static standing no  UE support                             Pertinent Vitals/Pain Pain Assessment Pain Assessment: 0-10 Pain Score: 1  Pain Location: L knee Pain Descriptors / Indicators: Burning Pain  Intervention(s): Limited activity within patient's tolerance, Monitored during session, Premedicated before session, Repositioned, Ice applied    Home Living Family/patient expects to be discharged to:: Private residence Living Arrangements: Spouse/significant other Available Help at Discharge: Family Type of Home: House Home Access: Stairs to enter Entrance Stairs-Rails: Left Entrance Stairs-Number of Steps: 6 Alternate Level Stairs-Number of Steps: 16 Home Layout: Multi-level;Able to live on main level with bedroom/bathroom Home Equipment: Rolling Walker (2 wheels);Shower seat;Cane - single point      Prior Function Prior Level of Function : Independent/Modified Independent             Mobility Comments: IND no AD for all ADLs, self care tasks and IADLs       Extremity/Trunk Assessment        Lower Extremity Assessment Lower Extremity Assessment: LLE deficits/detail LLE Deficits / Details: ankle DF/PF 5/5; SLR < 10 degree lag LLE Sensation: decreased light touch (pt reports grion and buttock region remaining numb)    Cervical / Trunk Assessment Cervical / Trunk Assessment: Normal  Communication   Communication Communication: No apparent difficulties  Cognition Arousal: Alert Behavior During Therapy: WFL for tasks assessed/performed Overall Cognitive Status: Within Functional Limits for tasks assessed                                          General Comments      Exercises Total Joint Exercises Ankle Circles/Pumps: AROM, Both, 10 reps Quad Sets: AROM, Left, 5 reps Short Arc Quad: AROM, Left, 5 reps Heel Slides: AROM, Left, 5 reps Hip ABduction/ADduction: AROM, Left, 5 reps Straight Leg Raises: AROM, Left, 5 reps Knee Flexion: AROM, Left, 5 reps, Seated   Assessment/Plan    PT Assessment Patient needs continued PT services  PT Problem List Decreased strength;Decreased range of motion;Decreased activity tolerance;Decreased  balance;Decreased mobility;Decreased coordination;Pain       PT Treatment Interventions DME instruction;Gait training;Functional mobility training;Stair training;Therapeutic activities;Therapeutic exercise;Balance training;Neuromuscular re-education;Patient/family education;Modalities    PT Goals (Current goals can be found in the Care Plan section)  Acute Rehab PT Goals Patient Stated Goal: full ROM and walking the golf course and exercise with wife 5 x wk PT Goal Formulation: With patient Time For Goal Achievement: 04/04/23 Potential to Achieve Goals: Good    Frequency 7X/week     Co-evaluation               AM-PAC PT "6 Clicks" Mobility  Outcome Measure Help needed turning from your back to your side while in a flat bed without using bedrails?: None Help needed moving from lying on your back to sitting on the side of a flat bed without using bedrails?: None Help needed moving to and from a bed to a chair (including a wheelchair)?: A Little Help needed standing up from a chair using your arms (e.g., wheelchair or bedside chair)?: A Little Help needed to walk in hospital room?: A Little Help needed climbing 3-5 steps with a railing? : A Little 6 Click Score: 20    End of Session Equipment Utilized During Treatment: Gait belt Activity Tolerance: Patient  tolerated treatment well;No increased pain Patient left: in chair;with call bell/phone within reach;with family/visitor present Nurse Communication: Mobility status;Other (comment) (pt readiness for d/c from PT standpoint) PT Visit Diagnosis: Unsteadiness on feet (R26.81);Other abnormalities of gait and mobility (R26.89);Muscle weakness (generalized) (M62.81);Pain;Difficulty in walking, not elsewhere classified (R26.2) Pain - Right/Left: Left Pain - part of body: Knee;Leg    Time: 1660-6301 PT Time Calculation (min) (ACUTE ONLY): 38 min   Charges:   PT Evaluation $PT Eval Low Complexity: 1 Low PT Treatments $Gait  Training: 8-22 mins $Therapeutic Exercise: 8-22 mins PT General Charges $$ ACUTE PT VISIT: 1 Visit         Johnny Bridge, PT Acute Rehab   Jacqualyn Posey 03/21/2023, 3:28 PM

## 2023-03-21 NOTE — Discharge Instructions (Signed)

## 2023-03-21 NOTE — Interval H&P Note (Signed)

## 2023-03-21 NOTE — Anesthesia Procedure Notes (Signed)
Spinal  Patient location during procedure: OR Start time: 03/21/2023 8:59 AM End time: 03/21/2023 9:02 AM Reason for block: surgical anesthesia Staffing Performed: anesthesiologist  Anesthesiologist: Kaylyn Layer, MD Performed by: Kaylyn Layer, MD Authorized by: Kaylyn Layer, MD   Preanesthetic Checklist Completed: patient identified, IV checked, risks and benefits discussed, surgical consent, monitors and equipment checked, pre-op evaluation and timeout performed Spinal Block Patient position: sitting Prep: DuraPrep and site prepped and draped Patient monitoring: continuous pulse ox, blood pressure and heart rate Approach: midline Location: L3-4 Injection technique: single-shot Needle Needle type: Pencan  Needle gauge: 24 G Needle length: 9 cm Assessment Events: CSF return Additional Notes Risks, benefits, and alternative discussed. Patient gave consent to procedure. Prepped and draped in sitting position. Patient sedated but responsive to voice. Clear CSF obtained after one needle pass. Positive terminal aspiration. No pain or paraesthesias with injection. Patient tolerated procedure well. Vital signs stable. Amalia Greenhouse, MD

## 2023-03-21 NOTE — Anesthesia Postprocedure Evaluation (Signed)
Anesthesia Post Note  Patient: Jacob Rodriguez  Procedure(s) Performed: TOTAL KNEE ARTHROPLASTY (Left: Knee)     Patient location during evaluation: PACU Anesthesia Type: Spinal Level of consciousness: awake and alert Pain management: pain level controlled Vital Signs Assessment: post-procedure vital signs reviewed and stable Respiratory status: spontaneous breathing, nonlabored ventilation and respiratory function stable Cardiovascular status: blood pressure returned to baseline Postop Assessment: no apparent nausea or vomiting, spinal receding, no headache and no backache Anesthetic complications: no   No notable events documented.  Last Vitals:  Vitals:   03/21/23 1145 03/21/23 1200  BP: 103/71 103/72  Pulse: 70 (!) 57  Resp: 11 15  Temp:  36.8 C  SpO2: 98% 100%    Last Pain:  Vitals:   03/21/23 1200  TempSrc:   PainSc: 0-No pain    LLE Motor Response: Purposeful movement (03/21/23 1200) LLE Sensation: Decreased (03/21/23 1200) RLE Motor Response: Purposeful movement (03/21/23 1200) RLE Sensation: Decreased (03/21/23 1200) L Sensory Level: S3-Medial thigh (03/21/23 1200) R Sensory Level: S3-Medial thigh (03/21/23 1200)  Shanda Howells

## 2023-03-21 NOTE — Anesthesia Procedure Notes (Signed)
Anesthesia Regional Block: Adductor canal block   Pre-Anesthetic Checklist: , timeout performed,  Correct Patient, Correct Site, Correct Laterality,  Correct Procedure, Correct Position, site marked,  Risks and benefits discussed,  Pre-op evaluation,  At surgeon's request and post-op pain management  Laterality: Left  Prep: Maximum Sterile Barrier Precautions used, chloraprep       Needles:  Injection technique: Single-shot  Needle Type: Echogenic Stimulator Needle     Needle Length: 9cm  Needle Gauge: 22     Additional Needles:   Procedures:,,,, ultrasound used (permanent image in chart),,    Narrative:  Start time: 03/21/2023 8:46 AM End time: 03/21/2023 8:49 AM Injection made incrementally with aspirations every 5 mL.  Performed by: Personally  Anesthesiologist: Kaylyn Layer, MD  Additional Notes: Risks, benefits, and alternative discussed. Patient gave consent for procedure. Patient prepped and draped in sterile fashion. Sedation administered, patient remains easily responsive to voice. Relevant anatomy identified with ultrasound guidance. Local anesthetic given in 5cc increments with no signs or symptoms of intravascular injection. No pain or paraesthesias with injection. Patient monitored throughout procedure with signs of LAST or immediate complications. Tolerated well. Ultrasound image placed in chart.  Amalia Greenhouse, MD

## 2023-03-22 ENCOUNTER — Encounter (HOSPITAL_COMMUNITY): Payer: Self-pay | Admitting: Orthopedic Surgery

## 2023-03-24 ENCOUNTER — Telehealth: Payer: Self-pay

## 2023-03-24 NOTE — Telephone Encounter (Signed)
Copied from CRM [id number]. Topic: Clinical - Medication Question >> Mar 24, 2023 10:30 AM Tiffany H wrote: Reason for CRM: Patient called to advise that he had knee surgery Monday. He's been taking Miralax and Colace 1x each per day since Monday but he has been unable to use the bathroom. Patient would like some recommendations. Patient advised that he's not in pain but is nauseated and uncomfortable.

## 2023-03-24 NOTE — Telephone Encounter (Signed)
Please advise, see below.   

## 2023-03-24 NOTE — Telephone Encounter (Signed)
Patient is aware of annotation below and verbalized understanding.  

## 2023-03-28 ENCOUNTER — Other Ambulatory Visit (HOSPITAL_BASED_OUTPATIENT_CLINIC_OR_DEPARTMENT_OTHER): Payer: Self-pay | Admitting: *Deleted

## 2023-03-28 NOTE — Telephone Encounter (Signed)
Received refill request from [facility] for Olmesartan-Amlodipine-hydrochlorothiazide and Crestor  After reviewing patients chart realized early this month on Rx's said had not started  Mychart message sent to patient

## 2023-03-29 ENCOUNTER — Other Ambulatory Visit (HOSPITAL_BASED_OUTPATIENT_CLINIC_OR_DEPARTMENT_OTHER): Payer: Self-pay | Admitting: Cardiovascular Disease

## 2023-03-31 ENCOUNTER — Other Ambulatory Visit (HOSPITAL_BASED_OUTPATIENT_CLINIC_OR_DEPARTMENT_OTHER): Payer: Self-pay | Admitting: *Deleted

## 2023-03-31 MED ORDER — ROSUVASTATIN CALCIUM 20 MG PO TABS: 20.0000 mg | ORAL_TABLET | Freq: Every day | ORAL | 3 refills | Status: DC

## 2023-03-31 NOTE — Telephone Encounter (Signed)
Received Rx request from [facility] for Rosuvastatin, Rx sent as requested

## 2023-04-06 ENCOUNTER — Other Ambulatory Visit (INDEPENDENT_AMBULATORY_CARE_PROVIDER_SITE_OTHER): Payer: Self-pay

## 2023-04-30 ENCOUNTER — Other Ambulatory Visit: Payer: Self-pay | Admitting: Family Medicine

## 2023-04-30 DIAGNOSIS — I10 Essential (primary) hypertension: Secondary | ICD-10-CM

## 2023-05-04 ENCOUNTER — Other Ambulatory Visit: Payer: Self-pay | Admitting: Family Medicine

## 2023-05-04 DIAGNOSIS — L659 Nonscarring hair loss, unspecified: Secondary | ICD-10-CM

## 2023-05-04 MED ORDER — PROPECIA 1 MG PO TABS: ORAL_TABLET | ORAL | 1 refills | Status: DC

## 2023-05-04 NOTE — Telephone Encounter (Signed)
Copied from CRM [id number]. Topic: Clinical - Medication Refill >> May 04, 2023  8:29 AM Irine Seal wrote: Most Recent Primary Care Visit:  Provider: Waymond Cera  Department: [facility]  Visit Type: NURSE VISIT  Date: 12/23/2022  Medication: PROPECIA 1 MG tablet  Has the patient contacted their pharmacy? Yes (Agent: If no, request that the patient contact the pharmacy for the refill. If patient does not wish to contact the pharmacy document the reason why and proceed with request.) (Agent: If yes, when and what did the pharmacy advise?) stated to contact PCP   Is this the correct pharmacy for this prescription? Yes If no, delete pharmacy and type the correct one.  This is the patient's preferred pharmacy:  [id number] [place], [place] - [address] AT [address] [place] [address] Phone: [phone] Fax: [phone]     Has the prescription been filled recently? No  Is the patient out of the medication? Yes  Has the patient been seen for an appointment in the last year OR does the patient have an upcoming appointment? Yes  Can we respond through MyChart? Yes  Agent: Please be advised that Rx refills may take up to 3 business days. We ask that you follow-up with your pharmacy.

## 2023-05-05 ENCOUNTER — Other Ambulatory Visit (HOSPITAL_COMMUNITY): Payer: Self-pay

## 2023-05-05 ENCOUNTER — Telehealth: Payer: Self-pay

## 2023-05-05 NOTE — Telephone Encounter (Signed)
Pharmacy Patient Advocate Encounter   Received notification from Pt Calls Messages that prior authorization for Propecia 1mg  is required/requested.   Insurance verification completed.   The patient is insured through [facility] .   Per test claim: PA required; PA started via CoverMyMeds. KEY [id number] . Waiting for clinical questions to populate.

## 2023-05-05 NOTE — Telephone Encounter (Signed)
Can we get a Prior Auth on Propecia 1mg ?   Patient ID [id number]

## 2023-05-06 ENCOUNTER — Telehealth: Payer: Self-pay

## 2023-05-06 DIAGNOSIS — L659 Nonscarring hair loss, unspecified: Secondary | ICD-10-CM

## 2023-05-06 MED ORDER — FINASTERIDE 1 MG PO TABS: 1.0000 mg | ORAL_TABLET | Freq: Every day | ORAL | 1 refills | Status: DC

## 2023-05-06 NOTE — Telephone Encounter (Signed)
Please advise   Copied from CRM [id number]. Topic: Clinical - Medication Question >> May 06, 2023 12:32 PM [place] wrote: Reason for CRM: Patients pharmacy denied his request for PROPECIA 1 MG tablet. He wanted to know if he can get the generic version of it called in.

## 2023-05-06 NOTE — Addendum Note (Signed)
Addended by: Andrez Grime on: 05/06/2023 04:38 PM   Modules accepted: Orders

## 2023-05-06 NOTE — Telephone Encounter (Signed)
 Clinical questions answered and PA submitted

## 2023-05-07 ENCOUNTER — Other Ambulatory Visit (INDEPENDENT_AMBULATORY_CARE_PROVIDER_SITE_OTHER): Payer: Self-pay

## 2023-05-09 ENCOUNTER — Encounter: Payer: Self-pay | Admitting: Family Medicine

## 2023-05-09 NOTE — Telephone Encounter (Signed)
Pharmacy Patient Advocate Encounter  Received notification from [facility] that Prior Authorization for Propecia 1mg   has been DENIED.  Full denial letter will be uploaded to the media tab. See denial reason below.   PA #/Case ID/Reference #: [id number]   DENIAL REASON: PLAN EXCLUSION

## 2023-05-10 NOTE — Telephone Encounter (Signed)
I called and left patient a detailed message that Rx was not covered and per Dr. Doreene Burke can use OTC Rogaine.

## 2023-05-16 ENCOUNTER — Ambulatory Visit: Payer: No Typology Code available for payment source | Admitting: Family Medicine

## 2023-05-18 ENCOUNTER — Ambulatory Visit (INDEPENDENT_AMBULATORY_CARE_PROVIDER_SITE_OTHER): Payer: No Typology Code available for payment source | Admitting: Cardiovascular Disease

## 2023-05-18 ENCOUNTER — Encounter (HOSPITAL_BASED_OUTPATIENT_CLINIC_OR_DEPARTMENT_OTHER): Payer: Self-pay | Admitting: Cardiovascular Disease

## 2023-05-18 VITALS — BP 116/73 | HR 79 | Ht 74.0 in | Wt 228.6 lb

## 2023-05-18 DIAGNOSIS — Z5181 Encounter for therapeutic drug level monitoring: Secondary | ICD-10-CM

## 2023-05-18 DIAGNOSIS — I10 Essential (primary) hypertension: Secondary | ICD-10-CM | POA: Diagnosis not present

## 2023-05-18 DIAGNOSIS — E785 Hyperlipidemia, unspecified: Secondary | ICD-10-CM

## 2023-05-18 NOTE — Progress Notes (Signed)
 [facility] Initial Assessment:    Date:  05/18/2023   ID:  Jacob Rodriguez, DOB 12-08-1955, MRN [id number]  PCP:  Mliss Sax, MD  Cardiologist:  Chilton Si, MD  Nephrologist:  Referring MD: Mliss Sax,*   CC: Hypertension  History of Present Illness:    Jacob Rodriguez is a 68 y.o. male [name] is a 68 y.o. male with a hx of hypertension here for follow-up.  He first established care in the [facility] 01/2023. He has worked with Dr. Doreene Burke and BP has been stable at home on irbesartan and carvedilol.  However, office BP has been above goal.   [name] was seen for surgery and BP was 164/82 in the office.  He has known white coat hypertension and was referred to the [facility].  At the initial visit he reported that his blood pressure had been slowly increasing over the prior year and a half.  His blood pressure had to be better controlled before being able to undergo surgery for his knee.  We discussed reducing NSAID intake and alcohol.  HCTZ was added.  He was seen for preoperative visit 02/2023 and blood pressure was much better controlled.  He was cleared for surgery.  It was noted 03/2022 that he was taking irbesartan in addition to olmesartan/amlodipine/hydrochlorothiazide and irbesartan was discontinued.  Recent blood pressures in the remote patient monitoring system have been in the 120s to 130s over 70s to 80s.  Mr. Reinders notes variability in his blood pressure readings with the provided cuff, ranging from 135/85 mmHg to 126 mmHg when he checks it back to back. He has experienced a significant decrease in episodes of feeling a 'rush' while sitting, which he could previously hear, and these episodes have now rarely occurred. He wakes up with tense shoulders, attributing it to hypertension, although he has not checked his blood pressure during these episodes.  He struggles with waking early  and being unable to fall back asleep.  He occasionally uses zolpidem and melatonin for sleep issues. He has cut down on Advil and Advil PM, using them occasionally for knee soreness.  He reports difficulty sleeping, waking up around 4:30 AM and being unable to return to sleep, averaging about six hours of sleep per night. He occasionally uses zolpidem and melatonin to aid sleep.  He underwent knee replacement surgery on December 16th, approximately eight weeks ago, and reports walking three miles at a time without significant issues, though he experiences some soreness. He occasionally uses Advil for knee soreness.  He has made dietary changes, reducing salt intake and limiting alcohol consumption to a couple of nights a week. He has also switched from Advil PM to Simply Sleep for occasional use. No lightheadedness, dizziness, breathing issues, or chest pressure during physical activity.      Previous antihypertensives: N/a  Past Medical History:  Diagnosis Date   Allergy    as a child   Arthritis    knees   Complication of anesthesia    PONV   Hypertension    Primary hypertension 01/19/2023    Past Surgical History:  Procedure Laterality Date   BICEPS TENDON REPAIR Right 2018   COLONOSCOPY     HYDROCELE EXCISION  01/09/1993   KNEE ARTHROSCOPY     rt x3   KNEE ARTHROSCOPY     x2 on left   POLYPECTOMY     ROTATOR CUFF REPAIR Right 1987   TOTAL KNEE  ARTHROPLASTY Right 02/05/2022   Procedure: TOTAL KNEE ARTHROPLASTY;  Surgeon: Joen Laura, MD;  Location: [facility];  Service: Orthopedics;  Laterality: Right;   TOTAL KNEE ARTHROPLASTY Left 03/21/2023   Procedure: TOTAL KNEE ARTHROPLASTY;  Surgeon: Joen Laura, MD;  Location: [facility];  Service: Orthopedics;  Laterality: Left;   TREATMENT FISTULA ANAL  1993    Current Medications: Current Meds  Medication Sig   carvedilol (COREG CR) 20 MG 24 hr capsule TAKE 1 CAPSULE AT BEDTIME   finasteride (PROPECIA) 1 MG tablet  Take 1 tablet (1 mg total) by mouth daily.   fluticasone (CUTIVATE) 0.05 % cream Apply 1 Application topically as needed (irritation).   glucosamine-chondroitin 500-400 MG tablet Take 2 tablets by mouth daily.   Multiple Vitamin (MULTIVITAMIN) tablet Take 1 tablet by mouth daily.   Olmesartan-amLODIPine-HCTZ 40-5-25 MG TABS Take 1 tablet by mouth daily.   promethazine (PHENERGAN) 12.5 MG tablet Take 1 tablet (12.5 mg total) by mouth every 8 (eight) hours as needed for nausea or vomiting.   rosuvastatin (CRESTOR) 20 MG tablet Take 1 tablet (20 mg total) by mouth daily.   sildenafil (REVATIO) 20 MG tablet May take 3 to 5 tablets 45 minutes prior with no more than 5 tablets daily. (Patient taking differently: Take 20 mg by mouth as needed (ED).)   TURMERIC PO Take 2 tablets by mouth daily.   valACYclovir (VALTREX) 1000 MG tablet TAKE 1/2 TABLET(500 MG) BY MOUTH TWICE DAILY FOR 3 DAYS AS NEEDED FOR OUTBREAK (Patient taking differently: Take 500 mg by mouth as needed (fever blisters).)   VITAMIN D PO Take 1 tablet by mouth daily.   zolpidem (AMBIEN CR) 6.25 MG CR tablet Take 1 tablet (6.25 mg total) by mouth at bedtime as needed for sleep.     Allergies:   Patient has no known allergies.   Social History   Socioeconomic History   Marital status: Married    Spouse name: Not on file   Number of children: Not on file   Years of education: Not on file   Highest education level: Not on file  Occupational History   Not on file  Tobacco Use   Smoking status: Never   Smokeless tobacco: Never  Vaping Use   Vaping status: Never Used  Substance and Sexual Activity   Alcohol use: Yes    Alcohol/week: 7.0 standard drinks of alcohol    Types: 7 Standard drinks or equivalent per week    Comment: 4 times a week couple drinks   Drug use: No   Sexual activity: Yes  Other Topics Concern   Not on file  Social History Narrative   Not on file   Social Drivers of Health   Financial Resource Strain:  Not on file  Food Insecurity: Not on file  Transportation Needs: Not on file  Physical Activity: Not on file  Stress: Not on file  Social Connections: Not on file     Family History: The patient's family history is negative for Colon cancer, Diabetes, Esophageal cancer, Rectal cancer, and Stomach cancer.  ROS:   Please see the history of present illness.    (+) Left knee pain (+) Left ankle swelling All other systems reviewed and are negative.  EKGs/Labs/Other Studies Reviewed:    EKG:  EKG is personally reviewed. 01/19/2023: Not ordered.  Recent Labs: 01/26/2023: TSH 1.670 03/11/2023: ALT 22; BUN 16; Creatinine, Ser 0.72; Hemoglobin 13.4; Platelets 286; Potassium 4.9; Sodium 139   Recent Lipid Panel  Component Value Date/Time   CHOL 194 12/23/2022 0814   TRIG 84.0 12/23/2022 0814   HDL 64.00 12/23/2022 0814   CHOLHDL 3 12/23/2022 0814   VLDL 16.8 12/23/2022 0814   LDLCALC 113 (H) 12/23/2022 0814    Physical Exam:    VS:  BP 116/73   Pulse 79   Ht 6\' 2"  (1.88 m)   Wt 228 lb 9.6 oz (103.7 kg)   SpO2 98%   BMI 29.35 kg/m  , BMI Body mass index is 29.35 kg/m. GENERAL:  Well appearing HEENT: Pupils equal round and reactive, fundi not visualized, oral mucosa unremarkable NECK:  No jugular venous distention, waveform within normal limits, carotid upstroke brisk and symmetric, no bruits, no thyromegaly LUNGS:  Clear to auscultation bilaterally HEART:  RRR.  PMI not displaced or sustained, S1 and S2 within normal limits, no S3, no S4, no clicks, no rubs, no murmurs ABD:  Flat, positive bowel sounds normal in frequency in pitch, no bruits, no rebound, no guarding, no midline pulsatile mass, no hepatomegaly, no splenomegaly EXT:  2 plus pulses throughout, no edema, no cyanosis, no clubbing SKIN:  No rashes, no nodules NEURO:  Cranial nerves II through XII grossly intact, motor grossly intact throughout PSYCH:  Cognitively intact, oriented to person place and  time   ASSESSMENT/PLAN:    # Hypertension Improved control with current regimen of extended release carvedilol, olmesartan/amlodipine, and hydrochlorothiazide. Noted decreased episodes of perceived hypertension. -Continue current regimen. -Check blood pressure periodically at home.  # Hyperlipidemia Last LDL cholesterol was 113 on rosuvastatin 20mg  daily. -Order fasting lipid panel and comprehensive metabolic panel. -Consider adjustment of rosuvastatin dose based on results.  # Sleep Disturbance Reports waking up in the middle of the night with difficulty returning to sleep. -Recommend creating a sleep-friendly environment (dark, quiet, no electronics). -Consider reading or other relaxing activities if unable to return to sleep.  # Knee Replacement Reports occasional soreness after walking. -Continue current activity level as tolerated.  Follow-up in 1 year unless issues arise.      Screening for Secondary Hypertension:     01/19/2023   10:59 AM  Causes  Drugs/Herbals Screened     - Comments +NSAIDS.  +EtOH 2-3 glasses 5 nights per week.  +2 cups coffee daily  Renovascular HTN N/A  Sleep Apnea Screened     - Comments no significant symptoms  Thyroid Disease Screened     - Comments Check TSH  Hyperaldosteronism Screened  Pheochromocytoma N/A  Cushing's Syndrome N/A  Hyperparathyroidism Screened  Coarctation of the Aorta Screened  Compliance Screened    Relevant Labs/Studies:    Latest Ref Rng & Units 03/11/2023   11:33 AM 01/26/2023    8:03 AM 12/23/2022    8:14 AM  Basic Labs  Sodium 135 - 145 mmol/L 139  138  140   Potassium 3.5 - 5.1 mmol/L 4.9  5.3  5.1   Creatinine 0.61 - 1.24 mg/dL 1.61  0.96  0.45      Disposition:      FU with Lillis Nuttle C. Duke Salvia, MD, [facility] in 1 year  Medication Adjustments/Labs and Tests Ordered: Current medicines are reviewed at length with the patient today.  Concerns regarding medicines are outlined above.   Orders Placed This  Encounter  Procedures   Lipid panel   Comprehensive metabolic panel   Cantril's Ladder Assessment   No orders of the defined types were placed in this encounter.    Signed, Chilton Si, MD  05/18/2023 9:14  AM    [facility]

## 2023-05-18 NOTE — Patient Instructions (Signed)
Medication Instructions:  Your physician recommends that you continue on your current medications as directed. Please refer to the Current Medication list given to you today.   Labwork: FASTING LP/CMET SOON   Testing/Procedures: NONE  Follow-Up: 1 YEAR WITH CAITLIN W NP OR DR    Any Other Special Instructions Will Be Listed Below (If Applicable). THANK YOU FOR PARTICIPATING IN THE HYPERTENSION STUDY AT YOUR CONVENIENCE PLEASE RETURN THE STUDY BLOOD PRESSURE MACHINE  If you need a refill on your cardiac medications before your next appointment, please call your pharmacy.

## 2023-06-01 LAB — LIPID PANEL
Chol/HDL Ratio: 2.9 ratio (ref 0.0–5.0)
Cholesterol, Total: 148 mg/dL (ref 100–199)
HDL: 51 mg/dL (ref >39)
LDL Chol Calc (NIH): 77 mg/dL (ref 0–99)
Triglycerides: 108 mg/dL (ref 0–149)
VLDL Cholesterol Cal: 20 mg/dL (ref 5–40)

## 2023-06-01 LAB — COMPREHENSIVE METABOLIC PANEL
ALT: 22 IU/L (ref 0–44)
AST: 22 IU/L (ref 0–40)
Albumin: 4.4 g/dL (ref 3.9–4.9)
Alkaline Phosphatase: 80 IU/L (ref 44–121)
BUN/Creatinine Ratio: 14 (ref 10–24)
BUN: 13 mg/dL (ref 8–27)
Bilirubin Total: 0.7 mg/dL (ref 0.0–1.2)
CO2: 22 mmol/L (ref 20–29)
Calcium: 9.6 mg/dL (ref 8.6–10.2)
Chloride: 102 mmol/L (ref 96–106)
Creatinine, Ser: 0.91 mg/dL (ref 0.76–1.27)
Globulin, Total: 2.4 g/dL (ref 1.5–4.5)
Glucose: 108 mg/dL — ABNORMAL HIGH (ref 70–99)
Potassium: 4.7 mmol/L (ref 3.5–5.2)
Sodium: 140 mmol/L (ref 134–144)
Total Protein: 6.8 g/dL (ref 6.0–8.5)
eGFR: 93 mL/min/{1.73_m2} (ref >59)

## 2023-06-28 ENCOUNTER — Other Ambulatory Visit (HOSPITAL_COMMUNITY): Payer: Self-pay | Admitting: Orthopedic Surgery

## 2023-06-28 DIAGNOSIS — M79605 Pain in left leg: Secondary | ICD-10-CM

## 2023-06-29 ENCOUNTER — Ambulatory Visit (HOSPITAL_COMMUNITY)
Admission: RE | Admit: 2023-06-29 | Discharge: 2023-06-29 | Disposition: A | Discharge status: Home / Self Care | Source: Ambulatory Visit | Attending: Cardiology | Admitting: Cardiology

## 2023-06-29 DIAGNOSIS — M79605 Pain in left leg: Secondary | ICD-10-CM | POA: Diagnosis not present

## 2023-07-15 ENCOUNTER — Encounter (HOSPITAL_BASED_OUTPATIENT_CLINIC_OR_DEPARTMENT_OTHER): Payer: Self-pay | Admitting: Cardiovascular Disease

## 2023-07-19 ENCOUNTER — Other Ambulatory Visit (HOSPITAL_BASED_OUTPATIENT_CLINIC_OR_DEPARTMENT_OTHER): Payer: Self-pay | Admitting: Family

## 2023-07-19 ENCOUNTER — Other Ambulatory Visit: Payer: Self-pay | Admitting: Family Medicine

## 2023-07-19 DIAGNOSIS — I10 Essential (primary) hypertension: Secondary | ICD-10-CM

## 2023-08-11 ENCOUNTER — Other Ambulatory Visit: Payer: Self-pay | Admitting: Family Medicine

## 2023-08-11 DIAGNOSIS — I10 Essential (primary) hypertension: Secondary | ICD-10-CM

## 2023-09-05 ENCOUNTER — Encounter: Payer: Self-pay | Admitting: Family Medicine

## 2023-09-05 ENCOUNTER — Ambulatory Visit (INDEPENDENT_AMBULATORY_CARE_PROVIDER_SITE_OTHER): Admitting: Family Medicine

## 2023-09-05 VITALS — BP 124/62 | HR 63 | Temp 97.3°F | Ht 74.0 in | Wt 228.8 lb

## 2023-09-05 DIAGNOSIS — E7800 Pure hypercholesterolemia, unspecified: Secondary | ICD-10-CM | POA: Diagnosis not present

## 2023-09-05 DIAGNOSIS — I10 Essential (primary) hypertension: Secondary | ICD-10-CM

## 2023-09-05 MED ORDER — CARVEDILOL PHOSPHATE ER 20 MG PO CP24: 20.0000 mg | ORAL_CAPSULE | Freq: Every day | ORAL | 2 refills | Status: AC

## 2023-09-05 NOTE — Progress Notes (Signed)
 Established Patient Office Visit   Subjective:  Patient ID: Jacob Rodriguez, male    DOB: 08-26-1955  Age: 68 y.o. MRN: [id number]  Chief Complaint  Patient presents with   Medical Management of Chronic Issues    Follow up. Medication refiil. Pt is not fasting.     HPI Encounter Diagnoses  Name Primary?   Elevated cholesterol Yes   Essential hypertension    Follow-up of above.  Blood pressure well-controlled with carvedilol  20 mg and olmesartan , amlodipine  and HCTZ.  Continues rosuvastatin  20 for elevated cholesterol at risk for CAD with elevated coronary artery calcium  score of 490.   Review of Systems  Constitutional: Negative.   HENT: Negative.    Eyes:  Negative for blurred vision, discharge and redness.  Respiratory: Negative.    Cardiovascular: Negative.   Gastrointestinal:  Negative for abdominal pain.  Genitourinary: Negative.   Musculoskeletal: Negative.  Negative for myalgias.  Skin:  Negative for rash.  Neurological:  Negative for tingling, loss of consciousness and weakness.  Endo/Heme/Allergies:  Negative for polydipsia.     Current Outpatient Medications:    finasteride  (PROPECIA ) 1 MG tablet, Take 1 tablet (1 mg total) by mouth daily., Disp: 90 tablet, Rfl: 1   fluticasone (CUTIVATE) 0.05 % cream, Apply 1 Application topically as needed (irritation)., Disp: , Rfl:    glucosamine-chondroitin 500-400 MG tablet, Take 2 tablets by mouth daily., Disp: , Rfl:    Multiple Vitamin (MULTIVITAMIN) tablet, Take 1 tablet by mouth daily., Disp: , Rfl:    Olmesartan -amLODIPine -HCTZ 40-5-25 MG TABS, TAKE 1 TABLET DAILY, Disp: 90 tablet, Rfl: 3   sildenafil  (REVATIO ) 20 MG tablet, May take 3 to 5 tablets 45 minutes prior with no more than 5 tablets daily. (Patient taking differently: Take 20 mg by mouth as needed (ED).), Disp: 25 tablet, Rfl: 0   TURMERIC PO, Take 2 tablets by mouth daily., Disp: , Rfl:    valACYclovir  (VALTREX ) 1000 MG tablet, TAKE 1/2 TABLET(500 MG) BY  MOUTH TWICE DAILY FOR 3 DAYS AS NEEDED FOR OUTBREAK (Patient taking differently: Take 500 mg by mouth as needed (fever blisters).), Disp: 10 tablet, Rfl: 0   VITAMIN D PO, Take 1 tablet by mouth daily., Disp: , Rfl:    zolpidem  (AMBIEN  CR) 6.25 MG CR tablet, Take 1 tablet (6.25 mg total) by mouth at bedtime as needed for sleep., Disp: 20 tablet, Rfl: 0   carvedilol  (COREG  CR) 20 MG 24 hr capsule, Take 1 capsule (20 mg total) by mouth at bedtime., Disp: 90 capsule, Rfl: 2   promethazine  (PHENERGAN ) 12.5 MG tablet, Take 1 tablet (12.5 mg total) by mouth every 8 (eight) hours as needed for nausea or vomiting. (Patient not taking: Reported on 09/05/2023), Disp: 14 tablet, Rfl: 0   rosuvastatin  (CRESTOR ) 20 MG tablet, Take 1 tablet (20 mg total) by mouth daily., Disp: 90 tablet, Rfl: 3   Objective:     BP 124/62 (Cuff Size: Normal)   Pulse 63   Temp (!) 97.3 F (36.3 C) (Temporal)   Ht 6\' 2"  (1.88 m)   Wt 228 lb 12.8 oz (103.8 kg)   SpO2 98%   BMI 29.38 kg/m    Physical Exam Constitutional:      General: He is not in acute distress.    Appearance: Normal appearance. He is not ill-appearing, toxic-appearing or diaphoretic.  HENT:     Head: Normocephalic and atraumatic.     Right Ear: External ear normal.     Left Ear:  External ear normal.     Mouth/Throat:     Mouth: Mucous membranes are moist.     Pharynx: Oropharynx is clear. No oropharyngeal exudate or posterior oropharyngeal erythema.  Eyes:     General: No scleral icterus.       Right eye: No discharge.        Left eye: No discharge.     Extraocular Movements: Extraocular movements intact.     Conjunctiva/sclera: Conjunctivae normal.     Pupils: Pupils are equal, round, and reactive to light.  Cardiovascular:     Rate and Rhythm: Normal rate and regular rhythm.     Pulses:          Dorsalis pedis pulses are 2+ on the right side and 2+ on the left side.       Posterior tibial pulses are 1+ on the right side and 1+ on the left  side.  Pulmonary:     Effort: Pulmonary effort is normal. No respiratory distress.     Breath sounds: Normal breath sounds.  Abdominal:     General: Bowel sounds are normal.  Musculoskeletal:     Cervical back: No rigidity or tenderness.  Skin:    General: Skin is warm and dry.  Neurological:     Mental Status: He is alert and oriented to person, place, and time.  Psychiatric:        Mood and Affect: Mood normal.        Behavior: Behavior normal.      No results found for any visits on 09/05/23.    The 10-year ASCVD risk score (Arnett DK, et al., 2019) is: 12.3%    Assessment & Plan:   Elevated cholesterol  Essential hypertension -     Carvedilol  Phosphate ER; Take 1 capsule (20 mg total) by mouth at bedtime.  Dispense: 90 capsule; Refill: 2    Return in about 6 months (around 03/06/2024) for annual physical.  Continue above medications.  Follow-up in 6 months for physical.  Tonna Frederic, MD

## 2023-11-10 ENCOUNTER — Other Ambulatory Visit: Payer: Self-pay | Admitting: Family Medicine

## 2023-11-10 DIAGNOSIS — L659 Nonscarring hair loss, unspecified: Secondary | ICD-10-CM

## 2024-03-22 ENCOUNTER — Other Ambulatory Visit (HOSPITAL_BASED_OUTPATIENT_CLINIC_OR_DEPARTMENT_OTHER): Payer: Self-pay | Admitting: Cardiovascular Disease

## 2024-04-16 ENCOUNTER — Encounter: Payer: Self-pay | Admitting: Internal Medicine

## 2024-05-03 ENCOUNTER — Ambulatory Visit

## 2024-05-03 VITALS — Ht 74.0 in | Wt 224.2 lb

## 2024-05-03 DIAGNOSIS — Z860100 Personal history of colon polyps, unspecified: Secondary | ICD-10-CM

## 2024-05-03 MED ORDER — NA SULFATE-K SULFATE-MG SULF 17.5-3.13-1.6 GM/177ML PO SOLN: 1.0000 | Freq: Once | ORAL | 0 refills | Status: AC

## 2024-05-03 NOTE — Progress Notes (Signed)

## 2024-05-08 ENCOUNTER — Encounter: Payer: Self-pay | Admitting: Internal Medicine

## 2024-05-17 ENCOUNTER — Encounter: Admitting: Internal Medicine
